# Patient Record
Sex: Male | Born: 1970 | Race: White | Hispanic: No | Marital: Married | Smoking: Never smoker
Health system: Southern US, Community
[De-identification: ages and names within clinical notes are randomized; demographics above are authoritative.]

## PROBLEM LIST (undated history)

## (undated) DIAGNOSIS — K219 Gastro-esophageal reflux disease without esophagitis: Secondary | ICD-10-CM

## (undated) DIAGNOSIS — K5900 Constipation, unspecified: Secondary | ICD-10-CM

## (undated) DIAGNOSIS — I099 Rheumatic heart disease, unspecified: Secondary | ICD-10-CM

## (undated) DIAGNOSIS — I00 Rheumatic fever without heart involvement: Secondary | ICD-10-CM

## (undated) DIAGNOSIS — E785 Hyperlipidemia, unspecified: Secondary | ICD-10-CM

## (undated) DIAGNOSIS — R011 Cardiac murmur, unspecified: Secondary | ICD-10-CM

## (undated) DIAGNOSIS — E781 Pure hyperglyceridemia: Secondary | ICD-10-CM

## (undated) HISTORY — DX: Gastro-esophageal reflux disease without esophagitis: K21.9

## (undated) HISTORY — DX: Cardiac murmur, unspecified: R01.1

## (undated) HISTORY — DX: Rheumatic fever without heart involvement: I00

## (undated) HISTORY — PX: INGUINAL HERNIA REPAIR: SUR1180

## (undated) HISTORY — DX: Constipation, unspecified: K59.00

## (undated) HISTORY — DX: Rheumatic heart disease, unspecified: I09.9

## (undated) HISTORY — DX: Hyperlipidemia, unspecified: E78.5

## (undated) HISTORY — DX: Pure hyperglyceridemia: E78.1

---

## 2009-09-25 ENCOUNTER — Encounter (INDEPENDENT_AMBULATORY_CARE_PROVIDER_SITE_OTHER): Payer: Self-pay | Admitting: *Deleted

## 2010-11-05 NOTE — Letter (Signed)
Summary: New Patient letter  [facility]  [address] [place], [place] [address]   Phone: [phone]  Fax: [phone]       09/25/2009 MRN: [id number]  Eduardo Mata [address] [place], [place]  [address]  Dear Mr. Eduardo Mata,  Welcome to the Gastroenterology Division at [facility].    You are scheduled to see Dr. Leone Payor on 10-30-09 at 10:00a.m. on the 3rd floor at [facility], [address].  We ask that you try to arrive at our office 15 minutes prior to your appointment time to allow for check-in.  We would like you to complete the enclosed self-administered evaluation form prior to your visit and bring it with you on the day of your appointment.  We will review it with you.  Also, please bring a complete list of all your medications or, if you prefer, bring the medication bottles and we will list them.  Please bring your insurance card so that we may make a copy of it.  If your insurance requires a referral to see a specialist, please bring your referral form from your primary care physician.  Co-payments are due at the time of your visit and may be paid by cash, check or credit card.     Your office visit will consist of a consult with your physician (includes a physical exam), any laboratory testing he/she may order, scheduling of any necessary diagnostic testing (e.g. x-ray, ultrasound, CT-scan), and scheduling of a procedure (e.g. Endoscopy, Colonoscopy) if required.  Please allow enough time on your schedule to allow for any/all of these possibilities.    If you cannot keep your appointment, please call [phone] to cancel or reschedule prior to your appointment date.  This allows [place] the opportunity to schedule an appointment for another patient in need of care.  If you do not cancel or reschedule by 5 p.m. the business day prior to your appointment date, you will be charged a $50.00 late cancellation/no-show fee.    Thank you for choosing  [facility] for your medical needs.  We appreciate the opportunity to care for you.  Please visit [place] at our website  to learn more about our practice.                     Sincerely,                                                             The Gastroenterology Division

## 2011-11-05 ENCOUNTER — Encounter: Payer: Self-pay | Admitting: Cardiology

## 2011-11-05 ENCOUNTER — Ambulatory Visit (INDEPENDENT_AMBULATORY_CARE_PROVIDER_SITE_OTHER): Payer: 59 | Admitting: Cardiology

## 2011-11-05 VITALS — BP 135/80 | HR 69 | Ht 68.0 in | Wt 183.0 lb

## 2011-11-05 DIAGNOSIS — R5381 Other malaise: Secondary | ICD-10-CM

## 2011-11-05 DIAGNOSIS — R5383 Other fatigue: Secondary | ICD-10-CM

## 2011-11-05 DIAGNOSIS — E781 Pure hyperglyceridemia: Secondary | ICD-10-CM

## 2011-11-05 DIAGNOSIS — I099 Rheumatic heart disease, unspecified: Secondary | ICD-10-CM

## 2011-11-05 NOTE — Progress Notes (Signed)
   HPI The patient presents for evaluation of rheumatic heart disease. He had a history of this age 41. He was followed over the years and treated with prophylaxis against recurrence. He's never been told that he had any heart disease however. He was discharged from the [employer] because of this history. He presents to have further evaluation as he has become concerned over the years that he could have some residual problem from this. He does note that he's had some decreased exercise tolerance. He says he tries to around a mile and he might get more fatigued than he thinks he should. However, he admits to not being as physically active as he used to be. He denies any chest pressure, neck or arm discomfort. He's not noticing any palpitations, presyncope or syncope. He has had no PND or orthopnea. He has had no weight gain or edema. He was recently found to have hypertriglyceridemia.  No Known Allergies  Current Outpatient Prescriptions  Medication Sig Dispense Refill  . fenofibrate (TRICOR) 145 MG tablet Take 145 mg by mouth daily.        Past Medical History  Diagnosis Date  . Rheumatic heart disease   . Hypertriglyceridemia     Past Surgical History  Procedure Date  . Inguinal hernia repair     bilateral   Family History:  No history of CAD or other significant history.  History   Social History  . Marital Status: Unknown    Spouse Name: N/A    Number of Children: 2  . Years of Education: N/A   Occupational History  . IT  [employer]   Social History Main Topics  . Smoking status: Never Smoker   . Smokeless tobacco: Not on file  . Alcohol Use: Not on file  . Drug Use: Not on file  . Sexually Active: Not on file   Other Topics Concern  . Not on file   Social History Narrative  . No narrative on file    ROS:  Positive for knee pain.  Otherwise as stated in the HPI and negative for all other systems.  PHYSICAL EXAM BP 135/80  Pulse 69  Ht 5\' 8"  (1.727 m)  Wt 183  lb (83.008 kg)  BMI 27.82 kg/m2 GENERAL:  Well appearing HEENT:  Pupils equal round and reactive, fundi not visualized, oral mucosa unremarkable NECK:  No jugular venous distention, waveform within normal limits, carotid upstroke brisk and symmetric, no bruits, no thyromegaly LYMPHATICS:  No cervical, inguinal adenopathy LUNGS:  Clear to auscultation bilaterally BACK:  No CVA tenderness CHEST:  Unremarkable HEART:  PMI not displaced or sustained,S1 and S2 within normal limits, no S3, no S4, no clicks, no rubs, no murmurs ABD:  Flat, positive bowel sounds normal in frequency in pitch, no bruits, no rebound, no guarding, no midline pulsatile mass, no hepatomegaly, no splenomegaly EXT:  2 plus pulses throughout, no edema, no cyanosis no clubbing SKIN:  No rashes no nodules NEURO:  Cranial nerves II through XII grossly intact, motor grossly intact throughout PSYCH:  Cognitively intact, oriented to person place and time  EKG:  Regular rhythm, unusual P wave axis possible ectopic atrial rhythm, minimal voltage criteria for left ventricular hypertrophy, nonspecific inferior T wave changes  11/05/2011  ASSESSMENT AND PLAN

## 2011-11-05 NOTE — Assessment & Plan Note (Signed)
He will continue with medications as listed. We discussed dietary changes that might help with this as well.

## 2011-11-05 NOTE — Assessment & Plan Note (Signed)
I will check an echocardiogram. If there are no valvular abnormalities then he can check back with me in a few years for routine physical exam. If there is any evidence of rheumatic involvement of his heart valves and I would follow him more closely.

## 2011-11-05 NOTE — Patient Instructions (Signed)

## 2011-11-05 NOTE — Assessment & Plan Note (Signed)
This is mild and I doubt that there is any cardiovascular etiology to this. We discussed an exercise regimen. If this should not improve with regular exercise I would want to see him back to reevaluate.

## 2011-11-12 ENCOUNTER — Ambulatory Visit (HOSPITAL_COMMUNITY): Payer: 59 | Attending: Cardiovascular Disease

## 2011-11-12 DIAGNOSIS — I079 Rheumatic tricuspid valve disease, unspecified: Secondary | ICD-10-CM | POA: Insufficient documentation

## 2011-11-12 DIAGNOSIS — I379 Nonrheumatic pulmonary valve disorder, unspecified: Secondary | ICD-10-CM | POA: Insufficient documentation

## 2011-11-12 DIAGNOSIS — I059 Rheumatic mitral valve disease, unspecified: Secondary | ICD-10-CM | POA: Insufficient documentation

## 2011-11-12 DIAGNOSIS — I099 Rheumatic heart disease, unspecified: Secondary | ICD-10-CM

## 2011-11-12 DIAGNOSIS — E785 Hyperlipidemia, unspecified: Secondary | ICD-10-CM | POA: Insufficient documentation

## 2011-11-12 DIAGNOSIS — R5381 Other malaise: Secondary | ICD-10-CM | POA: Insufficient documentation

## 2012-08-06 ENCOUNTER — Encounter (INDEPENDENT_AMBULATORY_CARE_PROVIDER_SITE_OTHER): Payer: Self-pay | Admitting: General Surgery

## 2012-08-09 ENCOUNTER — Ambulatory Visit (INDEPENDENT_AMBULATORY_CARE_PROVIDER_SITE_OTHER): Payer: 59 | Admitting: General Surgery

## 2012-08-09 ENCOUNTER — Encounter (INDEPENDENT_AMBULATORY_CARE_PROVIDER_SITE_OTHER): Payer: Self-pay | Admitting: General Surgery

## 2012-08-09 VITALS — BP 118/62 | HR 62 | Temp 98.0°F | Resp 18 | Ht 67.0 in | Wt 183.0 lb

## 2012-08-09 DIAGNOSIS — K648 Other hemorrhoids: Secondary | ICD-10-CM

## 2012-08-09 NOTE — Patient Instructions (Addendum)
You have large internal hemorrhoids, and we injected them with sclerosing solution today in hopes that will make them shrink away.  You do not have any bleeding or prolapse, and there is no indication for a surgical procedure at this time.  I strongly encouraged you to drink extra water, 6 or 7 glasses a day, and to take Metamucil or equivalent fiber supplement on a daily basis.  Please read the patient information booklet that you were given.  Return to see Dr. Derrell Lolling if further problems arise.

## 2012-08-09 NOTE — Progress Notes (Signed)
Patient ID: Eduardo Mata, male   DOB: 27-Oct-1970, 41 y.o.   MRN: [id number]  Chief Complaint  Patient presents with  . New Evaluation    hems    HPI Eduardo Mata is a 41 y.o. male.  He is referred by Dr. Charlott Rakes for evaluation of internal hemorrhoids.  The patient really doesn't have any symptoms of prolapse or bleeding or painful bowel movements. He says someone told him he had irritable bowel syndrome in the past. His symptoms really are more of a variant of constipation. He has to sit for a long time he says his stool passes slowly. He is using suppositories lately. His bowel movements pass slowly. His stools are not hard. He has a bowel movement either daily or about every 3 days ...it's variable. He has nottried  hydration or Fiber at this point.  Recent flexible sigmoidoscopy recently which showed  circumferential internal hemorrhoids but that was the only problem. He says Dr. Bosie Clos told him that was his problem.  Past history is significant for rheumatic fever as a child. Recent cardiology evaluation by Dr. Antoine Poche was apparently negative for any detectable heart disease. HPI  Past Medical History  Diagnosis Date  . Rheumatic heart disease   . Hypertriglyceridemia   . GERD (gastroesophageal reflux disease)   . Heart murmur   . Constipation     Past Surgical History  Procedure Date  . Inguinal hernia repair     bilateral    Family History  Problem Relation Age of Onset  . Barrett's esophagus Father     Social History History  Substance Use Topics  . Smoking status: Never Smoker   . Smokeless tobacco: Never Used  . Alcohol Use: No    No Known Allergies  Current Outpatient Prescriptions  Medication Sig Dispense Refill  . fenofibrate (TRICOR) 145 MG tablet Take 145 mg by mouth daily.      . hydrocortisone (ANUSOL-HC) 2.5 % rectal cream Place 1 application rectally 2 (two) times daily.      Maxwell Caul Bicarbonate (ZEGERID) 20-1100 MG  CAPS Take 1 capsule by mouth daily before breakfast.      . hydrocortisone (ANUSOL-HC) 25 MG suppository         Review of Systems Review of Systems  Constitutional: Negative for fever, chills and unexpected weight change.  HENT: Negative for hearing loss, congestion, sore throat, trouble swallowing and voice change.   Eyes: Negative for visual disturbance.  Respiratory: Negative for cough and wheezing.   Cardiovascular: Negative for chest pain, palpitations and leg swelling.  Gastrointestinal: Negative for nausea, vomiting, abdominal pain, diarrhea, constipation, blood in stool, abdominal distention, anal bleeding and rectal pain.  Genitourinary: Negative for hematuria and difficulty urinating.  Musculoskeletal: Negative for arthralgias.  Skin: Negative for rash and wound.  Neurological: Negative for seizures, syncope, weakness and headaches.  Hematological: Negative for adenopathy. Does not bruise/bleed easily.  Psychiatric/Behavioral: Negative for confusion.    Blood pressure 118/62, pulse 62, temperature 98 F (36.7 C), temperature source Oral, resp. rate 18, height 5\' 7"  (1.702 m), weight 183 lb (83.008 kg).  Physical Exam Physical Exam  Constitutional: He is oriented to person, place, and time. He appears well-developed and well-nourished. No distress.  HENT:  Head: Normocephalic.  Nose: Nose normal.  Mouth/Throat: No oropharyngeal exudate.  Eyes: Conjunctivae normal and EOM are normal. Pupils are equal, round, and reactive to light. Right eye exhibits no discharge. Left eye exhibits no discharge. No scleral icterus.  Neck:  Normal range of motion. Neck supple. No JVD present. No tracheal deviation present. No thyromegaly present.  Cardiovascular: Normal rate, regular rhythm, normal heart sounds and intact distal pulses.   No murmur heard. Pulmonary/Chest: Effort normal and breath sounds normal. No stridor. No respiratory distress. He has no wheezes. He has no rales. He  exhibits no tenderness.  Abdominal: Soft. Bowel sounds are normal. He exhibits no distension and no mass. There is no tenderness. There is no rebound and no guarding.  Genitourinary:       No external hemorrhoids, dermatitis, or perineal disease. Digital exam reveals increased sphincter tone, strong muscles, these do relax, no pain. No fissure. No blood. Anoscopy reveals internal hemorrhoids which were injected with sclerosing solution left lateral and right anterior. He tolerated this well.  Musculoskeletal: Normal range of motion. He exhibits no edema and no tenderness.  Lymphadenopathy:    He has no cervical adenopathy.  Neurological: He is alert and oriented to person, place, and time. He has normal reflexes. Coordination normal.  Skin: Skin is warm and dry. No rash noted. He is not diaphoretic. No erythema. No pallor.  Psychiatric: He has a normal mood and affect. His behavior is normal. Judgment and thought content normal.    Data Reviewed Dr. Marge Duncans office note and flexible sigmoidoscopy note  Assessment    Internal hemorrhoids. These may or may not be treating to his symptoms of prolonged defecation time and constipation. There may also be a component of irritable bowel syndrome to this.  Since there is no significant prolapse or bleeding, there does not appear to be any indication for surgical intervention    Plan    Internal hemorrhoids were injected with sclerosing solution today. Right anterior and left lateral.  For possible motility problems and IBS-like symptoms, emphasized the use of fiber diet, fiber supplementation, and hydration.  Information booklet given  Return to see me if further problems arise.       Eduardo Mata. Derrell Lolling, M.D., [facility] General and Minimally invasive Surgery Breast and Colorectal Surgery Office:   [phone] Pager:   [phone]  08/09/2012, 1:54 PM

## 2012-11-02 ENCOUNTER — Encounter (INDEPENDENT_AMBULATORY_CARE_PROVIDER_SITE_OTHER): Payer: Self-pay

## 2020-10-24 ENCOUNTER — Emergency Department (HOSPITAL_BASED_OUTPATIENT_CLINIC_OR_DEPARTMENT_OTHER): Payer: 59

## 2020-10-24 ENCOUNTER — Encounter (HOSPITAL_BASED_OUTPATIENT_CLINIC_OR_DEPARTMENT_OTHER): Payer: Self-pay | Admitting: Emergency Medicine

## 2020-10-24 ENCOUNTER — Other Ambulatory Visit: Payer: Self-pay

## 2020-10-24 ENCOUNTER — Emergency Department (HOSPITAL_BASED_OUTPATIENT_CLINIC_OR_DEPARTMENT_OTHER)
Admission: EM | Admit: 2020-10-24 | Discharge: 2020-10-24 | Disposition: A | Discharge status: Home / Self Care | Payer: 59 | Attending: Emergency Medicine | Admitting: Emergency Medicine

## 2020-10-24 DIAGNOSIS — R197 Diarrhea, unspecified: Secondary | ICD-10-CM

## 2020-10-24 DIAGNOSIS — R42 Dizziness and giddiness: Secondary | ICD-10-CM | POA: Insufficient documentation

## 2020-10-24 DIAGNOSIS — U071 COVID-19: Secondary | ICD-10-CM

## 2020-10-24 LAB — CBC WITH DIFFERENTIAL/PLATELET
Abs Immature Granulocytes: 0.05 10*3/uL (ref 0.00–0.07)
Basophils Absolute: 0 10*3/uL (ref 0.0–0.1)
Basophils Relative: 0 %
Eosinophils Absolute: 0 10*3/uL (ref 0.0–0.5)
Eosinophils Relative: 0 %
HCT: 47.8 % (ref 39.0–52.0)
Hemoglobin: 16.8 g/dL (ref 13.0–17.0)
Immature Granulocytes: 1 %
Lymphocytes Relative: 19 %
Lymphs Abs: 0.9 10*3/uL (ref 0.7–4.0)
MCH: 31.2 pg (ref 26.0–34.0)
MCHC: 35.1 g/dL (ref 30.0–36.0)
MCV: 88.8 fL (ref 80.0–100.0)
Monocytes Absolute: 0.3 10*3/uL (ref 0.1–1.0)
Monocytes Relative: 7 %
Neutro Abs: 3.4 10*3/uL (ref 1.7–7.7)
Neutrophils Relative %: 73 %
Platelets: 186 10*3/uL (ref 150–400)
RBC: 5.38 MIL/uL (ref 4.22–5.81)
RDW: 12.3 % (ref 11.5–15.5)
WBC: 4.7 10*3/uL (ref 4.0–10.5)
nRBC: 0 % (ref 0.0–0.2)

## 2020-10-24 LAB — COMPREHENSIVE METABOLIC PANEL
ALT: 32 U/L (ref 0–44)
AST: 29 U/L (ref 15–41)
Albumin: 4.1 g/dL (ref 3.5–5.0)
Alkaline Phosphatase: 65 U/L (ref 38–126)
Anion gap: 9 (ref 5–15)
BUN: 10 mg/dL (ref 6–20)
CO2: 26 mmol/L (ref 22–32)
Calcium: 8.6 mg/dL — ABNORMAL LOW (ref 8.9–10.3)
Chloride: 104 mmol/L (ref 98–111)
Creatinine, Ser: 0.91 mg/dL (ref 0.61–1.24)
GFR, Estimated: >60 mL/min (ref >60)
Glucose, Bld: 96 mg/dL (ref 70–99)
Potassium: 4 mmol/L (ref 3.5–5.1)
Sodium: 139 mmol/L (ref 135–145)
Total Bilirubin: 0.8 mg/dL (ref 0.3–1.2)
Total Protein: 6.9 g/dL (ref 6.5–8.1)

## 2020-10-24 LAB — URINALYSIS, ROUTINE W REFLEX MICROSCOPIC
Bilirubin Urine: NEGATIVE
Glucose, UA: NEGATIVE mg/dL
Hgb urine dipstick: NEGATIVE
Ketones, ur: NEGATIVE mg/dL
Leukocytes,Ua: NEGATIVE
Nitrite: NEGATIVE
Protein, ur: NEGATIVE mg/dL
Specific Gravity, Urine: 1.005 (ref 1.005–1.030)
pH: 6.5 (ref 5.0–8.0)

## 2020-10-24 LAB — TROPONIN I (HIGH SENSITIVITY)
Troponin I (High Sensitivity): 3 ng/L (ref <18)
Troponin I (High Sensitivity): 4 ng/L (ref <18)

## 2020-10-24 LAB — LIPASE, BLOOD: Lipase: 25 U/L (ref 11–51)

## 2020-10-24 MED ORDER — SODIUM CHLORIDE 0.9 % IV BOLUS
1000.0000 mL | Freq: Once | INTRAVENOUS | Status: AC
  Administered 2020-10-24: 1000 mL via INTRAVENOUS

## 2020-10-24 NOTE — ED Provider Notes (Signed)
[facility] EMERGENCY DEPARTMENT Provider Note   CSN: [id number] Arrival date & time: 10/24/20  0934     History Chief Complaint  Patient presents with  . Diarrhea    Eduardo Mata is a 50 y.o. male history of GERD, high triglycerides, rheumatic heart disease.  Patient presents today for diarrhea, fever/chills and lightheadedness.  Patient is unvaccinated against COVID-19 and reports that he was diagnosed with COVID 12 days ago.  Initially he had symptoms including body aches, headache, sore throat, loss of taste/smell and small episodes of nonbloody diarrhea and nausea.  His symptoms gradually improved but over the last 2 days his diarrhea has worsened he reports multiple episodes of nonbloody diarrhea over the past few days without abdominal pain.  He reports that while having diarrhea he will feel warm or chills, today he stood up and became lightheaded and felt like he was going to pass out, he sat down and the sensation passed without syncopal event.  Denies headache, vision changes, neck stiffness, chest pain/shortness of breath, abdominal pain, hematemesis, hemoptysis, melena, hematochezia, extremity swelling/color change, history of blood clot, exogenous hormone use, recent surgery/immobilization or any additional concerns.  HPI     Past Medical History:  Diagnosis Date  . Constipation   . GERD (gastroesophageal reflux disease)   . Heart murmur   . Hypertriglyceridemia   . Rheumatic heart disease     Patient Active Problem List   Diagnosis Date Noted  . Fatigue 11/05/2011  . Rheumatic heart disease   . Hypertriglyceridemia     Past Surgical History:  Procedure Laterality Date  . INGUINAL HERNIA REPAIR     bilateral       Family History  Problem Relation Age of Onset  . Barrett's esophagus Father     Social History   Tobacco Use  . Smoking status: Never Smoker  . Smokeless tobacco: Never Used  Substance Use Topics  . Alcohol use: Yes  .  Drug use: No    Home Medications Prior to Admission medications   Medication Sig Start Date End Date Taking? Authorizing Provider  fenofibrate (TRICOR) 145 MG tablet Take 145 mg by mouth daily.    [provider]  hydrocortisone (ANUSOL-HC) 2.5 % rectal cream Place 1 application rectally 2 (two) times daily.    [provider]  hydrocortisone (ANUSOL-HC) 25 MG suppository  07/26/12   [provider]  Omeprazole-Sodium Bicarbonate (ZEGERID) 20-1100 MG CAPS Take 1 capsule by mouth daily before breakfast.    [provider]    Allergies    Patient has no known allergies.  Review of Systems   Review of Systems ten systems are reviewed and are negative for acute change except as noted in the HPI  Physical Exam Updated Vital Signs BP 125/80 (BP Location: Right Arm)   Pulse 71   Temp 97.6 F (36.4 C) (Oral)   Resp 15   Ht 5\' 8"  (1.727 m)   Wt 81.6 kg   SpO2 98%   BMI 27.37 kg/m   Physical Exam Constitutional:      General: He is not in acute distress.    Appearance: Normal appearance. He is well-developed. He is not ill-appearing or diaphoretic.  HENT:     Head: Normocephalic and atraumatic.     Right Ear: External ear normal.     Left Ear: External ear normal.     Nose: Rhinorrhea present.     Mouth/Throat:     Mouth: Mucous membranes are  moist.     Pharynx: Oropharynx is clear.  Eyes:     General: Vision grossly intact. Gaze aligned appropriately.     Conjunctiva/sclera: Conjunctivae normal.     Pupils: Pupils are equal, round, and reactive to light.  Neck:     Trachea: Trachea and phonation normal.  Cardiovascular:     Rate and Rhythm: Normal rate and regular rhythm.     Pulses: Normal pulses.  Pulmonary:     Effort: Pulmonary effort is normal. No respiratory distress.  Abdominal:     General: There is no distension.     Palpations: Abdomen is soft.     Tenderness: There is no abdominal tenderness. There is no guarding or  rebound.  Musculoskeletal:        General: Normal range of motion.     Cervical back: Normal range of motion.     Right lower leg: No edema.     Left lower leg: No edema.  Skin:    General: Skin is warm and dry.  Neurological:     Mental Status: He is alert.     GCS: GCS eye subscore is 4. GCS verbal subscore is 5. GCS motor subscore is 6.     Comments: Speech is clear and goal oriented, follows commands Major Cranial nerves without deficit, no facial droop Moves extremities without ataxia, coordination intact  Psychiatric:        Behavior: Behavior normal.     ED Results / Procedures / Treatments   Labs (all labs ordered are listed, but only abnormal results are displayed) Labs Reviewed  URINALYSIS, ROUTINE W REFLEX MICROSCOPIC - Abnormal; Notable for the following components:      Result Value   Color, Urine STRAW (*)    All other components within normal limits  COMPREHENSIVE METABOLIC PANEL - Abnormal; Notable for the following components:   Calcium 8.6 (*)    All other components within normal limits  CBC WITH DIFFERENTIAL/PLATELET  LIPASE, BLOOD  TROPONIN I (HIGH SENSITIVITY)  TROPONIN I (HIGH SENSITIVITY)    EKG EKG Interpretation  Date/Time:  Wednesday October 24 2020 10:56:28 EST Ventricular Rate:  80 PR Interval:    QRS Duration: 77 QT Interval:  357 QTC Calculation: 412 R Axis:   27 Text Interpretation: Sinus rhythm Confirmed by Benjiman Core [phone]) on 10/24/2020 1:42:37 PM   Radiology DG Chest Portable 1 View  Result Date: 10/24/2020 CLINICAL DATA:  COVID diagnosis on the 1/7.  Chills.  Dizzy. EXAM: PORTABLE CHEST 1 VIEW COMPARISON:  None. FINDINGS: The heart size and mediastinal contours are within normal limits. Both lungs are clear. No visible pleural effusions or pneumothorax. No acute osseous abnormality. IMPRESSION: No active disease. Electronically Signed   By: Feliberto Harts MD   On: 10/24/2020 11:04    Procedures Procedures  (including critical care time)  Medications Ordered in ED Medications  sodium chloride 0.9 % bolus 1,000 mL (0 mLs Intravenous Stopped 10/24/20 1252)    ED Course  I have reviewed the triage vital signs and the nursing notes.  Pertinent labs & imaging results that were available during my care of the patient were reviewed by me and considered in my medical decision making (see chart for details).    MDM Rules/Calculators/A&P                         Additional history obtained from: 1. Nursing notes from this visit. 2. Review of electronic medical records, no  recent visits available. --------------------- 50 year old unvaccinated male presents today after being diagnosed with COVID 12 days ago.  His symptoms of rhinorrhea, body aches, loss of taste and smell, sore throat have resolved.  He has had persistent nonbloody diarrhea that has worsened over the past few days without associated abdominal pain.  He reports a near syncopal episode today without associated chest pain or shortness of breath.  Physical examination is reassuring, cranial nerves intact, no meningeal signs, airway clear without evidence of deep space infection.  Cardiopulmonary exam unremarkable.  Abdomen soft nontender.  Neurovascular intact to all four extremities without evidence of DVT.  Patient's history is concerning for possible dehydration in setting multiple days of diarrhea.  Will obtain basic labs and give IV fluids and obtain orthostatic vital signs. ---------------------------------------------- I ordered, reviewed and interpreted labs which include: CBC within normal limits, no leukocytosis to suggest bacterial infection, no anemia. CMP shows no emergent electrolyte derangement, AKI, LFT elevations or gap.  Lipase within normal limits, doubt pancreatitis. Urinalysis shows no evidence of infection and no hemoglobin. High-sensitivity troponin within normal limits x2 (3->4) CXR:  IMPRESSION:  No active disease.    EKG: Sinus rhythm Confirmed by Benjiman CorePickering, Nathan [phone]([id number]) on 10/24/2020 1:42:37 PM  Orthostatics within normal limits. ---------------------------------- Patient's work-up today is reassuring.  His 1 episode of lightheadedness has not recurred and his orthostatics are reassuring.  Vital signs have remained stable throughout ER visit.  Patient without chest pain or shortness of breath, low suspicion for ACS, PE, dissection, CVA/TIA, hypoglycemia, anemia, arrhythmia or other emergent pathologies as etiology of symptoms today.  Suspect patient with some dehydration orthostasis in the setting of multiple days of diarrhea.  Patient has no abdominal tenderness on exam low suspicion for appendicitis, diverticulitis or other emergent intra-abdominal pathologies, no indication for CT imaging of the abdomen/pelvis at this time.  Patient has been tolerating p.o. in the emergency department, I encourage patient to maintain water hydration get plenty of rest and follow-up with his PCP this week.  At this time there does not appear to be any evidence of an acute emergency medical condition and the patient appears stable for discharge with appropriate outpatient follow up. Diagnosis was discussed with patient who verbalizes understanding of care plan and is agreeable to discharge. I have discussed return precautions with patient who verbalizes understanding. Patient encouraged to follow-up with their PCP. All questions answered.  Patient's case discussed with Rubin PayorPickering who agrees with plan to discharge with follow-up.   Eduardo Mata was evaluated in Emergency Department on 10/24/2020 for the symptoms described in the history of present illness. He was evaluated in the context of the global COVID-19 pandemic, which necessitated consideration that the patient might be at risk for infection with the SARS-CoV-2 virus that causes COVID-19. Institutional protocols and algorithms that pertain to the evaluation of patients  at risk for COVID-19 are in a state of rapid change based on information released by regulatory bodies including the CDC and federal and state organizations. These policies and algorithms were followed during the patient's care in the ED.  Note: Portions of this report may have been transcribed using voice recognition software. Every effort was made to ensure accuracy; however, inadvertent computerized transcription errors may still be present. Final Clinical Impression(s) / ED Diagnoses Final diagnoses:  Diarrhea, unspecified type  COVID-19    Rx / DC Orders ED Discharge Orders    None       Bill SalinasMorelli, Eunice Oldaker A, PA-C 10/24/20 1350  Benjiman Core, MD 10/24/20 1525

## 2020-10-24 NOTE — Discharge Instructions (Addendum)
At this time there does not appear to be the presence of an emergent medical condition, however there is always the potential for conditions to change. Please read and follow the below instructions.  Please return to the Emergency Department immediately for any new or worsening symptoms. Please be sure to follow up with your Primary Care Provider within one week regarding your visit today; please call their office to schedule an appointment even if you are feeling better for a follow-up visit. Please drink plenty of water to avoid dehydration and get plenty of rest.  Go to the nearest Emergency Department immediately if: You have fever or chills You have chest pain. You feel very weak or you pass out (faint). You have bloody or black poop or poop that looks like tar. You have very bad pain, cramping, or bloating in your belly (abdomen). You have trouble breathing or you are breathing very quickly. Your heart is beating very quickly. You pass out (loss consciousness) Your skin feels cold and clammy. You feel confused. You have signs of losing too much water in your body, such as: Dark pee, very little pee, or no pee. Cracked lips. Dry mouth. Sunken eyes. Sleepiness. Weakness. You have any new/concerning or worsening of symptoms   Please read the additional information packets attached to your discharge summary.  Do not take your medicine if  develop an itchy rash, swelling in your mouth or lips, or difficulty breathing; call 911 and seek immediate emergency medical attention if this occurs.  You may review your lab tests and imaging results in their entirety on your MyChart account.  Please discuss all results of fully with your primary care provider and other specialist at your follow-up visit.  Note: Portions of this text may have been transcribed using voice recognition software. Every effort was made to ensure accuracy; however, inadvertent computerized transcription errors may  still be present.

## 2020-10-24 NOTE — ED Triage Notes (Signed)
States dx with covid on the 7 and started to have  Diarrhea and chills and felt dizzy today

## 2021-01-11 ENCOUNTER — Other Ambulatory Visit: Payer: Self-pay

## 2021-01-11 ENCOUNTER — Other Ambulatory Visit: Payer: 59 | Admitting: Internal Medicine

## 2021-01-11 DIAGNOSIS — Z0000 Encounter for general adult medical examination without abnormal findings: Secondary | ICD-10-CM

## 2021-01-11 DIAGNOSIS — I099 Rheumatic heart disease, unspecified: Secondary | ICD-10-CM

## 2021-01-11 DIAGNOSIS — Z125 Encounter for screening for malignant neoplasm of prostate: Secondary | ICD-10-CM

## 2021-01-11 DIAGNOSIS — E781 Pure hyperglyceridemia: Secondary | ICD-10-CM

## 2021-01-12 LAB — CBC WITH DIFFERENTIAL/PLATELET
Absolute Monocytes: 359 cells/uL (ref 200–950)
Basophils Absolute: 18 cells/uL (ref 0–200)
Basophils Relative: 0.4 %
Eosinophils Absolute: 32 cells/uL (ref 15–500)
Eosinophils Relative: 0.7 %
HCT: 47.3 % (ref 38.5–50.0)
Hemoglobin: 16.1 g/dL (ref 13.2–17.1)
Lymphs Abs: 1748 cells/uL (ref 850–3900)
MCH: 31.5 pg (ref 27.0–33.0)
MCHC: 34 g/dL (ref 32.0–36.0)
MCV: 92.6 fL (ref 80.0–100.0)
MPV: 8.9 fL (ref 7.5–12.5)
Monocytes Relative: 7.8 %
Neutro Abs: 2443 cells/uL (ref 1500–7800)
Neutrophils Relative %: 53.1 %
Platelets: 215 10*3/uL (ref 140–400)
RBC: 5.11 10*6/uL (ref 4.20–5.80)
RDW: 12.8 % (ref 11.0–15.0)
Total Lymphocyte: 38 %
WBC: 4.6 10*3/uL (ref 3.8–10.8)

## 2021-01-12 LAB — COMPLETE METABOLIC PANEL WITH GFR
AG Ratio: 2.3 (calc) (ref 1.0–2.5)
ALT: 16 U/L (ref 9–46)
AST: 21 U/L (ref 10–35)
Albumin: 5.3 g/dL — ABNORMAL HIGH (ref 3.6–5.1)
Alkaline phosphatase (APISO): 77 U/L (ref 35–144)
BUN: 13 mg/dL (ref 7–25)
CO2: 28 mmol/L (ref 20–32)
Calcium: 10.1 mg/dL (ref 8.6–10.3)
Chloride: 103 mmol/L (ref 98–110)
Creat: 0.98 mg/dL (ref 0.70–1.33)
GFR, Est African American: 103 mL/min/{1.73_m2} (ref >=60)
GFR, Est Non African American: 89 mL/min/{1.73_m2} (ref >=60)
Globulin: 2.3 g/dL (calc) (ref 1.9–3.7)
Glucose, Bld: 83 mg/dL (ref 65–99)
Potassium: 4 mmol/L (ref 3.5–5.3)
Sodium: 140 mmol/L (ref 135–146)
Total Bilirubin: 0.8 mg/dL (ref 0.2–1.2)
Total Protein: 7.6 g/dL (ref 6.1–8.1)

## 2021-01-12 LAB — LIPID PANEL
Cholesterol: 237 mg/dL — ABNORMAL HIGH (ref <200)
HDL: 37 mg/dL — ABNORMAL LOW (ref >=40)
LDL Cholesterol (Calc): 166 mg/dL (calc) — ABNORMAL HIGH
Non-HDL Cholesterol (Calc): 200 mg/dL (calc) — ABNORMAL HIGH (ref <130)
Total CHOL/HDL Ratio: 6.4 (calc) — ABNORMAL HIGH (ref <5.0)
Triglycerides: 183 mg/dL — ABNORMAL HIGH (ref <150)

## 2021-01-12 LAB — PSA: PSA: 0.56 ng/mL (ref <=4.0)

## 2021-01-15 ENCOUNTER — Ambulatory Visit (INDEPENDENT_AMBULATORY_CARE_PROVIDER_SITE_OTHER): Payer: 59 | Admitting: Internal Medicine

## 2021-01-15 ENCOUNTER — Other Ambulatory Visit: Payer: Self-pay

## 2021-01-15 ENCOUNTER — Encounter: Payer: Self-pay | Admitting: Internal Medicine

## 2021-01-15 VITALS — BP 120/80 | HR 88 | Ht 68.0 in | Wt 170.0 lb

## 2021-01-15 DIAGNOSIS — Z8719 Personal history of other diseases of the digestive system: Secondary | ICD-10-CM

## 2021-01-15 DIAGNOSIS — E782 Mixed hyperlipidemia: Secondary | ICD-10-CM | POA: Diagnosis not present

## 2021-01-15 DIAGNOSIS — K4090 Unilateral inguinal hernia, without obstruction or gangrene, not specified as recurrent: Secondary | ICD-10-CM

## 2021-01-15 DIAGNOSIS — Z23 Encounter for immunization: Secondary | ICD-10-CM

## 2021-01-15 DIAGNOSIS — I099 Rheumatic heart disease, unspecified: Secondary | ICD-10-CM | POA: Diagnosis not present

## 2021-01-15 DIAGNOSIS — K219 Gastro-esophageal reflux disease without esophagitis: Secondary | ICD-10-CM

## 2021-01-15 DIAGNOSIS — Z0000 Encounter for general adult medical examination without abnormal findings: Secondary | ICD-10-CM

## 2021-01-15 LAB — POCT URINALYSIS DIPSTICK
Appearance: NEGATIVE
Bilirubin, UA: NEGATIVE
Blood, UA: NEGATIVE
Glucose, UA: NEGATIVE
Ketones, UA: NEGATIVE
Leukocytes, UA: NEGATIVE
Nitrite, UA: NEGATIVE
Odor: NEGATIVE
Protein, UA: NEGATIVE
Spec Grav, UA: 1.015 (ref 1.010–1.025)
Urobilinogen, UA: 0.2 E.U./dL
pH, UA: 6 (ref 5.0–8.0)

## 2021-01-15 MED ORDER — ROSUVASTATIN CALCIUM 5 MG PO TABS: 5.0000 mg | ORAL_TABLET | Freq: Every day | ORAL | 3 refills | Status: DC

## 2021-01-15 NOTE — Progress Notes (Addendum)
Subjective:    Patient ID: Eduardo Mata, male    DOB: [birth year], 50 y.o.   MRN: [id number]  HPI 50 year old Male presents to the office for the first time today for health maintenance exam.  He was referred by Patterson Hammersmith.  Patient says he had rheumatic fever diagnosed in 82 when he was 50 years old.  As far as he knows he has had no significant sequela from that.  Had left inguinal hernia repair 1998 by Dr. Delrae Alfred in [facility].  He thinks he may have another hernia on the right side.  He has noticed a bulge in the right inguinal area.  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  Takes multivitamin, Krill oil and vitamin C&D.  Last tetanus immunization was in 2012 given for his work.  He has a history of Barrett's esophagus and reflux followed by Dr. Bosie Clos in [facility].  Has been treated with Zegerid and Dexilant in the past.  He is currently on Nexium.  History of internal hemorrhoids seen by Dr. Claud Kelp.  History of constipation.  Internal hemorrhoids were injected with sclerosing solution by Dr. Derrell Lolling in 2013.  He saw Dr. Antoine Poche in January 2013 for cardiac evaluation with history of rheumatic heart disease apparently treated with antibiotics.  He has never been told he has any structural heart disease.  Apparently was discharged from [employer] because of this history.  Dr. Antoine Poche performed a 2D echocardiogram.  Mitral valve was normal.  Trivial regurgitation noted.  No stenosis.  Tricuspid valve normal with trivial regurgitation.  Aorta was normal and not dilated.  No defect noted in atrial septum.  Left atrium was normal in size.  Left ventricle was normal.  Patient has been taking TriCor for hyperlipidemia.  I would like for him down to try Crestor 5 mg daily because he has mixed hyperlipidemia.  Patient requests another 2D echocardiogram and would like to see Dr. Antoine Poche once again.  He had last tetanus immunization in 2012 at  work and this was updated today.  Family history remarkable for kidney stones in both parents otherwise negative.  Social history: He is married.  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  He completed 4 years of college.  Enjoys swimming tennis walking and travel.  Does not smoke and rarely drinks alcohol.  He resides in [place].  Wife is employed by [employer].  He has 2 children.     Says  he was told he had a heart murmur in the remote past however Dr. Antoine Poche did not hear a murmur when he saw him in 2013.    Objective:   Physical Exam Blood pressure 120/80 pulse 88 regular pulse oximetry 97% weight 170 pounds height 5 feet 8 inches BMI 25.85  Pleasant male in no acute distress.  Respiratory rate is normal.  Skin: Warm and dry.  Nodes: None.  TMs are clear.  Neck is supple without JVD thyromegaly or carotid bruits.  Chest is clear to auscultation without rales or wheezing.  Cardiac exam: Regular rate and rhythm normal S1 and S2 without murmurs or gallops appreciated.  Abdomen is soft nondistended without hepatosplenomegaly masses or tenderness.  No lower extremity pitting edema or deformity.  Prostate is normal without masses.  Neuro: Intact without gross focal deficits.  Has bulge in right inguinal area and likely has hernia.  I cannot confirm hernia with direct palpation but he has  fullness in the right inguinal area.       Assessment & Plan:  Patient gives history of rheumatic heart disease but evaluation by Dr. Antoine Poche in 2013 was normal.  Patient would like to see Dr. Antoine Poche once again for evaluation.  Hyperlipidemia-mixed: he will be started on Crestor 5 mg daily with follow-up here in 3 months.  History of GE reflux and Barrett's esophagus currently being treated by gastroenterologist with Nexium 20 mg daily.  Dr. Bosie Clos is gastroenterologist.  Had colon cancer screening February 2022.  History of internal hemorrhoids seen by  Dr. Derrell Lolling several years ago  Health maintenance-tetanus immunization update given today  Likely has right inguinal hernia with bulge in the right pubic area.  Discussion regarding diet and exercise.  Needs to watch weight and get regular exercise.  Advised follow-up on hyperlipidemia here in July with lipid panel liver functions and office visit.  Referral being made to Dr. Antoine Poche.

## 2021-01-16 NOTE — Patient Instructions (Signed)
It was a pleasure to see you today.  Please start Crestor 5 mg daily and follow-up here in July with fasting lipid panel and office visit.  We will make referral to Dr. Antoine Poche as you requested.  Tetanus immunization update given today.

## 2021-01-22 ENCOUNTER — Telehealth: Payer: Self-pay | Admitting: Internal Medicine

## 2021-01-22 ENCOUNTER — Other Ambulatory Visit: Payer: Self-pay

## 2021-01-22 DIAGNOSIS — I099 Rheumatic heart disease, unspecified: Secondary | ICD-10-CM

## 2021-01-22 NOTE — Telephone Encounter (Signed)
Pt called and said he was told last week to go have an echocardiogram done and he went to do it and they said order was deleted and said they couldn't schedule it because it was deleted. He said he was told to call us back. Pt request call back with what to do from here

## 2021-01-22 NOTE — Telephone Encounter (Signed)
I have taken care of this

## 2021-01-22 NOTE — Telephone Encounter (Signed)
It has been reordered

## 2021-02-13 ENCOUNTER — Other Ambulatory Visit: Payer: Self-pay

## 2021-02-13 ENCOUNTER — Ambulatory Visit (HOSPITAL_COMMUNITY): Payer: 59 | Attending: Cardiology

## 2021-02-13 DIAGNOSIS — I099 Rheumatic heart disease, unspecified: Secondary | ICD-10-CM | POA: Insufficient documentation

## 2021-02-13 LAB — ECHOCARDIOGRAM COMPLETE
Area-P 1/2: 2.94 cm2
S' Lateral: 2.6 cm

## 2021-03-06 ENCOUNTER — Encounter (HOSPITAL_BASED_OUTPATIENT_CLINIC_OR_DEPARTMENT_OTHER): Payer: Self-pay | Admitting: Urology

## 2021-03-06 ENCOUNTER — Telehealth: Payer: Self-pay | Admitting: Internal Medicine

## 2021-03-06 ENCOUNTER — Other Ambulatory Visit: Payer: Self-pay

## 2021-03-06 ENCOUNTER — Emergency Department (HOSPITAL_BASED_OUTPATIENT_CLINIC_OR_DEPARTMENT_OTHER): Payer: 59

## 2021-03-06 ENCOUNTER — Emergency Department (HOSPITAL_BASED_OUTPATIENT_CLINIC_OR_DEPARTMENT_OTHER)
Admission: EM | Admit: 2021-03-06 | Discharge: 2021-03-06 | Disposition: A | Discharge status: Home / Self Care | Payer: 59 | Attending: Emergency Medicine | Admitting: Emergency Medicine

## 2021-03-06 DIAGNOSIS — R202 Paresthesia of skin: Secondary | ICD-10-CM | POA: Diagnosis present

## 2021-03-06 DIAGNOSIS — M542 Cervicalgia: Secondary | ICD-10-CM | POA: Diagnosis not present

## 2021-03-06 DIAGNOSIS — R200 Anesthesia of skin: Secondary | ICD-10-CM

## 2021-03-06 DIAGNOSIS — R251 Tremor, unspecified: Secondary | ICD-10-CM | POA: Insufficient documentation

## 2021-03-06 LAB — CBC WITH DIFFERENTIAL/PLATELET
Abs Immature Granulocytes: 0.02 10*3/uL (ref 0.00–0.07)
Basophils Absolute: 0 10*3/uL (ref 0.0–0.1)
Basophils Relative: 0 %
Eosinophils Absolute: 0 10*3/uL (ref 0.0–0.5)
Eosinophils Relative: 0 %
HCT: 41.5 % (ref 39.0–52.0)
Hemoglobin: 14.3 g/dL (ref 13.0–17.0)
Immature Granulocytes: 0 %
Lymphocytes Relative: 27 %
Lymphs Abs: 1.9 10*3/uL (ref 0.7–4.0)
MCH: 31.2 pg (ref 26.0–34.0)
MCHC: 34.5 g/dL (ref 30.0–36.0)
MCV: 90.6 fL (ref 80.0–100.0)
Monocytes Absolute: 0.5 10*3/uL (ref 0.1–1.0)
Monocytes Relative: 7 %
Neutro Abs: 4.4 10*3/uL (ref 1.7–7.7)
Neutrophils Relative %: 66 %
Platelets: 192 10*3/uL (ref 150–400)
RBC: 4.58 MIL/uL (ref 4.22–5.81)
RDW: 12.4 % (ref 11.5–15.5)
WBC: 6.8 10*3/uL (ref 4.0–10.5)
nRBC: 0 % (ref 0.0–0.2)

## 2021-03-06 LAB — COMPREHENSIVE METABOLIC PANEL
ALT: 20 U/L (ref 0–44)
AST: 23 U/L (ref 15–41)
Albumin: 4.6 g/dL (ref 3.5–5.0)
Alkaline Phosphatase: 65 U/L (ref 38–126)
Anion gap: 7 (ref 5–15)
BUN: 11 mg/dL (ref 6–20)
CO2: 25 mmol/L (ref 22–32)
Calcium: 9.2 mg/dL (ref 8.9–10.3)
Chloride: 104 mmol/L (ref 98–111)
Creatinine, Ser: 0.89 mg/dL (ref 0.61–1.24)
GFR, Estimated: >60 mL/min (ref >60)
Glucose, Bld: 87 mg/dL (ref 70–99)
Potassium: 3.5 mmol/L (ref 3.5–5.1)
Sodium: 136 mmol/L (ref 135–145)
Total Bilirubin: 0.8 mg/dL (ref 0.3–1.2)
Total Protein: 7.3 g/dL (ref 6.5–8.1)

## 2021-03-06 MED ORDER — DEXAMETHASONE SODIUM PHOSPHATE 10 MG/ML IJ SOLN
10.0000 mg | Freq: Once | INTRAMUSCULAR | Status: AC
  Administered 2021-03-06: 10 mg via INTRAVENOUS
  Filled 2021-03-06: qty 1

## 2021-03-06 NOTE — Telephone Encounter (Signed)
Eduardo Mata  [phone]  [name] had sent a mychart message on 03/05/2021 wanting an appointment about his fingers shaking and twitching. So I sent a message back asking when this started, trying to get more information. He sent back that maybe it started post COVID, two months ago, not saying it is related. Occasionally on left ring finger but has become more persistent now and includes more fingers on both hands.

## 2021-03-06 NOTE — ED Provider Notes (Signed)
[facility] EMERGENCY DEPARTMENT Provider Note   CSN: [id number] Arrival date & time: 03/06/21  1823     History Chief Complaint  Patient presents with  . Numbness  . Tremors    Eduardo Mata is a 50 y.o. male with PMH/o GERD, heart murmur, hypertriglyceridemia, rheumatic heart disease who presents for evaluation of numbness and tingling to bilateral hands and bilateral feet that has been ongoing for the last 3 days.  He states that he notices it more at night and states that when he is asleep, he will feel like it goes from his mid thighs all the way down.  He states sometimes he has a burning sensation in the legs and then he feels like his feet and the bottoms of his feet are numb.  He states if he gets up and walks around, this is better.  He states that he has some of the numbness in the plantar surface of his feet throughout the day but it is not as bad.  He also reports that he has tingling and numbness in his bilateral hands.  He states that this is worse at night as well.  At night, he will have numbness that extends from the right mid arm all the way distally.  He states that the right upper extremity is isolated mainly to the right hand.  He states that it is better when he gets up and moves around.  He also reports that he has had a tremor in his left fourth and fifth digit that has been ongoing for about a month or so.  He states it gets worse when he is moving it and when he closes his fingers together, he feels like he can stabilize it.  He has not sought evaluation for this.  He comes in the emergency department today because of the new numbness/tingling.  He states he has also been having some pain in the mid neck.  He states that this is new as well.  No trauma, injury.  He denies any fevers, chills, chest pain, difficulty breathing, abdominal pain, nausea/vomiting, weakness of his extremities.  No family history of brain cancer, MS.  He does report his grandfather had  Parkinson's.  The history is provided by the patient.       Past Medical History:  Diagnosis Date  . Constipation   . GERD (gastroesophageal reflux disease)   . Heart murmur   . Hypertriglyceridemia   . Rheumatic heart disease     Patient Active Problem List   Diagnosis Date Noted  . Fatigue 11/05/2011  . Rheumatic heart disease   . Hypertriglyceridemia     Past Surgical History:  Procedure Laterality Date  . INGUINAL HERNIA REPAIR     bilateral       Family History  Problem Relation Age of Onset  . Barrett's esophagus Father     Social History   Tobacco Use  . Smoking status: Never Smoker  . Smokeless tobacco: Never Used  Substance Use Topics  . Alcohol use: Yes    Comment: occasionally   . Drug use: No    Home Medications Prior to Admission medications   Medication Sig Start Date End Date Taking? Authorizing Provider  Ascorbic Acid (VITAMIN C) 500 MG CAPS See admin instructions.    [provider]  Cholecalciferol (VITAMIN D3 MAXIMUM STRENGTH) 125 MCG (5000 UT) capsule See admin instructions.    [provider]  esomeprazole (NEXIUM) 20 MG capsule 1 capsule  [provider]  Multiple Vitamins-Minerals (MULTIVITAMIN ADULTS 50+ PO) 1 tablet    [provider]  omega-3 acid ethyl esters (LOVAZA) 1 g capsule Take by mouth 2 (two) times daily.    [provider]  rosuvastatin (CRESTOR) 5 MG tablet Take 1 tablet (5 mg total) by mouth daily. 01/15/21   Margaree Mackintosh, MD    Allergies    Patient has no known allergies.  Review of Systems   Review of Systems  Constitutional: Negative for fever.  Respiratory: Negative for cough and shortness of breath.   Cardiovascular: Negative for chest pain.  Gastrointestinal: Negative for abdominal pain, nausea and vomiting.  Genitourinary: Negative for dysuria and hematuria.  Musculoskeletal: Positive for neck pain.  Neurological: Positive for numbness. Negative for  weakness and headaches.  All other systems reviewed and are negative.   Physical Exam Updated Vital Signs BP 133/86 (BP Location: Left Arm)   Pulse 85   Temp 98.7 F (37.1 C) (Oral)   Resp 18   Ht 5\' 8"  (1.727 m)   Wt 73 kg   SpO2 99%   BMI 24.48 kg/m   Physical Exam Vitals and nursing note reviewed.  Constitutional:      Appearance: Normal appearance. He is well-developed.  HENT:     Head: Normocephalic and atraumatic.  Eyes:     General: Lids are normal.     Conjunctiva/sclera: Conjunctivae normal.     Pupils: Pupils are equal, round, and reactive to light.     Comments: PERRL. EOMs intact. No nystagmus. No neglect.   Neck:      Comments: Tenderness palpation in midline C-spine.  No deformity crepitus noted.  No overlying warmth, erythema, edema. Cardiovascular:     Rate and Rhythm: Normal rate and regular rhythm.     Pulses: Normal pulses.          Radial pulses are 2+ on the right side and 2+ on the left side.       Dorsalis pedis pulses are 2+ on the right side and 2+ on the left side.     Heart sounds: Normal heart sounds. No murmur heard. No friction rub. No gallop.   Pulmonary:     Effort: Pulmonary effort is normal.     Breath sounds: Normal breath sounds.     Comments: Lungs clear to auscultation bilaterally.  Symmetric chest rise.  No wheezing, rales, rhonchi. Abdominal:     Palpations: Abdomen is soft. Abdomen is not rigid.     Tenderness: There is no abdominal tenderness. There is no guarding.     Comments: Abdomen is soft, non-distended, non-tender. No rigidity, No guarding. No peritoneal signs.  Musculoskeletal:        General: Normal range of motion.  Skin:    General: Skin is warm and dry.     Capillary Refill: Capillary refill takes less than 2 seconds.     Comments: Good distal cap refill. BUE and BLE is not dusky in appearance or cool to touch.  Neurological:     Mental Status: He is alert and oriented to person, place, and time.     Comments:  Cranial nerves III-XII intact Follows commands, Moves all extremities  5/5 strength to BUE and BLE  Reports some decrease sensation noted to the left fourth and fifth digit.  Reports some decrease sensation in plantar surface of bilateral feet. Normal finger to nose. No dysdiadochokinesia. No slurred speech. No facial droop.   Psychiatric:  Speech: Speech normal.     ED Results / Procedures / Treatments   Labs (all labs ordered are listed, but only abnormal results are displayed) Labs Reviewed  COMPREHENSIVE METABOLIC PANEL  CBC WITH DIFFERENTIAL/PLATELET  VITAMIN B12  TSH    EKG None  Radiology CT Head Wo Contrast  Result Date: 03/06/2021 CLINICAL DATA:  Numbness, tingling to hands and feet EXAM: CT HEAD WITHOUT CONTRAST TECHNIQUE: Contiguous axial images were obtained from the base of the skull through the vertex without intravenous contrast. COMPARISON:  None. FINDINGS: Brain: Calcifications within the right cerebellum. No acute intracranial abnormality. Specifically, no hemorrhage, hydrocephalus, mass lesion, acute infarction, or significant intracranial injury. Vascular: No hyperdense vessel or unexpected calcification. Skull: No acute calvarial abnormality. Sinuses/Orbits: No acute findings Other: None IMPRESSION: No acute intracranial abnormality. Electronically Signed   By: Charlett Nose M.D.   On: 03/06/2021 20:00   CT Cervical Spine Wo Contrast  Result Date: 03/06/2021 CLINICAL DATA:  Numbness to hands and feet.  Neck pain EXAM: CT CERVICAL SPINE WITHOUT CONTRAST TECHNIQUE: Multidetector CT imaging of the cervical spine was performed without intravenous contrast. Multiplanar CT image reconstructions were also generated. COMPARISON:  None. FINDINGS: Alignment: No subluxation.  Loss of cervical lordosis. Skull base and vertebrae: No acute fracture. No primary bone lesion or focal pathologic process. Soft tissues and spinal canal: No prevertebral fluid or swelling. No  visible canal hematoma. Disc levels:  Disc space narrowing at C5-6 and C6-7.  Mild spurring. 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 No acute bony abnormality. Electronically Signed   By: Charlett Nose M.D.   On: 03/06/2021 20:01    Procedures Procedures   Medications Ordered in ED Medications  dexamethasone (DECADRON) injection 10 mg (10 mg Intravenous Given 03/06/21 2005)    ED Course  I have reviewed the triage vital signs and the nursing notes.  Pertinent labs & imaging results that were available during my care of the patient were reviewed by me and considered in my medical decision making (see chart for details).    MDM Rules/Calculators/A&P                          50 year old male who presents for evaluation of numbness and tingling into bilateral hands, bilateral feet that have been ongoing for last 3 days.  Also endorses that he has had tremor of his left fourth digit that is been ongoing for about a month or so.  No fevers, weakness, chest pain, difficulty breathing.  Endorses some pain to the back of his neck which he states is new.  On initial arrival, he is afebrile, nontoxic-appearing.  Vital signs are stable.  He is good pulses in bilateral hands, feet.  Exam not concerning for ischemic limbs.  He does report some decrease sensation in bilateral hands, bilateral feet.  No weakness. Do not suspect CVA given his distribution of symptoms and the fact that it will intermittently occur. Question if there is a radiculopathy component to it.  Will obtain labs to look for electrolyte abnormalities as well as CT head to look for any intracranial mass.  CMP shows normal electrolytes.  CBC without any significant leukocytosis or anemia. CT head negative for any acute abnormality. CT cervical shows early degenerative changes in the cervical spine. No acute bony abnormality.  Discussed results with patient.  At this time,  patient with no weakness.  He is hemodynamically  stable.  He is able to ambulate without any difficulty.  Question of this is radiculopathy versus neuro etiology.  He does have a vitamin B12 and TSH pending.  At this time, patient stable for discharge.  Do not feel that patient warrants emergent MRI imaging as do not have concern for acute stroke.  We will plan for outpatient neurology follow-up.  Patient instructed to follow-up with his primary care doctor regarding his additional blood work. Discussed patient with Dr. Particia NearingHaviland who is agreeable to plan. At this time, patient exhibits no emergent life-threatening condition that require further evaluation in ED. Patient had ample opportunity for questions and discussion. All patient's questions were answered with full understanding. Strict return precautions discussed. Patient expresses understanding and agreement to plan.   Portions of this note were generated with Scientist, clinical (histocompatibility and immunogenetics)Dragon dictation software. Dictation errors may occur despite best attempts at proofreading.   Final Clinical Impression(s) / ED Diagnoses Final diagnoses:  Numbness    Rx / DC Orders ED Discharge Orders         Ordered    Ambulatory referral to Neurology       Comments: An appointment is requested in approximately: 1 week   03/06/21 2129           Rosana HoesLayden, Raine Blodgett A, PA-C 03/06/21 2205    Jacalyn LefevreHaviland, Julie, MD 03/06/21 [phone]

## 2021-03-06 NOTE — ED Triage Notes (Signed)
States numbness and tingling to hands and feet x 2 days, also states fingers tremmors and tingling.

## 2021-03-06 NOTE — Telephone Encounter (Addendum)
Eduardo Mata just called and said he is now having numbness in his feet and land hands, so [facility]

## 2021-03-06 NOTE — Discharge Instructions (Signed)
As we discussed, your work-up today looked reassuring.  You have a thyroid and B12 lab pending.  You can check on MyChart regarding this result.  Follow-up with your primary care doctor regarding this.  As we discussed, we have given you outpatient referral to neurology to follow-up with your symptoms.  Return to the emergency department for any weakness, worsening numbness, difficulty walking or any other worsening concerning symptoms.

## 2021-03-07 LAB — TSH: TSH: 1.859 u[IU]/mL (ref 0.350–4.500)

## 2021-03-07 LAB — VITAMIN B12: Vitamin B-12: 611 pg/mL (ref 180–914)

## 2021-03-10 ENCOUNTER — Encounter: Payer: Self-pay | Admitting: Cardiology

## 2021-03-10 DIAGNOSIS — R011 Cardiac murmur, unspecified: Secondary | ICD-10-CM | POA: Insufficient documentation

## 2021-03-10 NOTE — Progress Notes (Signed)
Cardiology Office Note   Date:  03/11/2021   ID:  Eduardo Mata, DOB 1971-05-26, MRN [id number]  PCP:  Margaree Mackintosh, MD  Cardiologist:   None Referring:  Margaree Mackintosh, MD  Chief Complaint  Patient presents with  . Numbness      History of Present Illness: Eduardo Mata is a 50 y.o. male who history of rheumatic heart disease and a heart murmur.  However, I did see the echo done recently that demonstrated no abnormalities.    He has not had a murmur since he was a teenager.  In 2013 I saw him and his echo was unremarkable.  He had COVID in January.  He did okay with that but he thinks he had some mild problems following that though he does not know that they are related.  He felt some gastric upset.  He has had some pain between his shoulder blades.  He has had sporadic numbness in his feet and arms.  His arms will be tingling.  It might wake him up.  He has to get up and move around for to go away.  He has some mild burning discomfort.  He has a tremor he is going to see a neurologist.  He is quite active.  He gets 12-15,000 steps a day.  He walks for exercise and does not have any problems with this.  He does have dyslipidemia and was recently started on Lipitor.  He is not describing any new shortness of breath, PND or orthopnea.  Has not been having any palpitations, presyncope or syncope.  Past Medical History:  Diagnosis Date  . Dyslipidemia   . GERD (gastroesophageal reflux disease)   . Rheumatic fever     Past Surgical History:  Procedure Laterality Date  . INGUINAL HERNIA REPAIR     bilateral     Current Outpatient Medications  Medication Sig Dispense Refill  . Ascorbic Acid (VITAMIN C) 500 MG CAPS See admin instructions.    . Cholecalciferol (VITAMIN D3 MAXIMUM STRENGTH) 125 MCG (5000 UT) capsule See admin instructions.    Marland Kitchen esomeprazole (NEXIUM) 20 MG capsule 1 capsule    . Multiple Vitamins-Minerals (MULTIVITAMIN ADULTS 50+ PO) 1 tablet    . omega-3 acid  ethyl esters (LOVAZA) 1 g capsule Take by mouth 2 (two) times daily.    . rosuvastatin (CRESTOR) 5 MG tablet Take 1 tablet (5 mg total) by mouth daily. 90 tablet 3   No current facility-administered medications for this visit.    Allergies:   Patient has no known allergies.    Social History:  The patient  reports that he has never smoked. He has never used smokeless tobacco. He reports current alcohol use. He reports that he does not use drugs.   Family History:  The patient's family history includes Barrett's esophagus in his father.    ROS:  Please see the history of present illness.   Otherwise, review of systems are positive for none.   All other systems are reviewed and negative.    PHYSICAL EXAM: VS:  BP 112/70 (BP Location: Left Arm, Patient Position: Sitting, Cuff Size: Normal)   Pulse 69   Ht 5\' 8"  (1.727 m)   Wt 166 lb (75.3 kg)   BMI 25.24 kg/m  , BMI Body mass index is 25.24 kg/m. GENERAL:  Well appearing HEENT:  Pupils equal round and reactive, fundi not visualized, oral mucosa unremarkable NECK:  No jugular venous distention, waveform within normal  limits, carotid upstroke brisk and symmetric, no bruits, no thyromegaly LYMPHATICS:  No cervical, inguinal adenopathy LUNGS:  Clear to auscultation bilaterally BACK:  No CVA tenderness CHEST:  Unremarkable HEART:  PMI not displaced or sustained,S1 and S2 within normal limits, no S3, no S4, no clicks, no rubs, no murmurs ABD:  Flat, positive bowel sounds normal in frequency in pitch, no bruits, no rebound, no guarding, no midline pulsatile mass, no hepatomegaly, no splenomegaly EXT:  2 plus pulses throughout, no edema, no cyanosis no clubbing SKIN:  No rashes no nodules NEURO:  Cranial nerves II through XII grossly intact, motor grossly intact throughout PSYCH:  Cognitively intact, oriented to person place and time    EKG:  EKG is ordered today. The ekg ordered today demonstrates sinus rhythm, rate 69, axis within  normal limits, intervals within normal limits, no acute ST-T wave changes.   Recent Labs: 03/06/2021: ALT 20; BUN 11; Creatinine, Ser 0.89; Hemoglobin 14.3; Platelets 192; Potassium 3.5; Sodium 136; TSH 1.859    Lipid Panel    Component Value Date/Time   CHOL 237 (H) 01/11/2021 0930   TRIG 183 (H) 01/11/2021 0930   HDL 37 (L) 01/11/2021 0930   CHOLHDL 6.4 (H) 01/11/2021 0930   LDLCALC 166 (H) 01/11/2021 0930      Wt Readings from Last 3 Encounters:  03/11/21 166 lb (75.3 kg)  03/06/21 161 lb (73 kg)  01/15/21 170 lb (77.1 kg)      Other studies Reviewed: Additional studies/ records that were reviewed today include: Echo ordered by Margaree Mackintosh, MD. Review of the above records demonstrates:  Please see elsewhere in the note.     ASSESSMENT AND PLAN:  History of rheumatic fever: He has no history of rheumatic heart disease.  No further work-up is indicated.  He has no murmur.  Dyslipidemia: He does have significantly elevated LDL at 166 with an HDL of 37.  There is some distant family history.  I am going to screen him with a coronary calcium score which will help set goals of therapy.  He was recently started on statin.  We talked about a plant-based diet.  We talked about exercise.   Current medicines are reviewed at length with the patient today.  The patient does not have concerns regarding medicines.  The following changes have been made:  no change  Labs/ tests ordered today include:   Orders Placed This Encounter  Procedures  . CT CARDIAC SCORING (SELF PAY ONLY)  . EKG 12-Lead     Disposition:   FU with me as needed   Signed, Rollene Rotunda, MD  03/11/2021 10:38 AM    [facility]

## 2021-03-11 ENCOUNTER — Encounter: Payer: Self-pay | Admitting: Cardiology

## 2021-03-11 ENCOUNTER — Emergency Department (HOSPITAL_BASED_OUTPATIENT_CLINIC_OR_DEPARTMENT_OTHER)
Admission: EM | Admit: 2021-03-11 | Discharge: 2021-03-12 | Disposition: A | Discharge status: Home / Self Care | Payer: 59 | Attending: Emergency Medicine | Admitting: Emergency Medicine

## 2021-03-11 ENCOUNTER — Encounter (HOSPITAL_BASED_OUTPATIENT_CLINIC_OR_DEPARTMENT_OTHER): Payer: Self-pay

## 2021-03-11 ENCOUNTER — Ambulatory Visit: Payer: 59 | Admitting: Cardiology

## 2021-03-11 ENCOUNTER — Other Ambulatory Visit: Payer: Self-pay

## 2021-03-11 VITALS — BP 112/70 | HR 69 | Ht 68.0 in | Wt 166.0 lb

## 2021-03-11 DIAGNOSIS — R202 Paresthesia of skin: Secondary | ICD-10-CM | POA: Insufficient documentation

## 2021-03-11 DIAGNOSIS — I00 Rheumatic fever without heart involvement: Secondary | ICD-10-CM

## 2021-03-11 DIAGNOSIS — Z20822 Contact with and (suspected) exposure to covid-19: Secondary | ICD-10-CM | POA: Diagnosis not present

## 2021-03-11 DIAGNOSIS — M546 Pain in thoracic spine: Secondary | ICD-10-CM | POA: Diagnosis not present

## 2021-03-11 DIAGNOSIS — R011 Cardiac murmur, unspecified: Secondary | ICD-10-CM | POA: Diagnosis not present

## 2021-03-11 DIAGNOSIS — M47812 Spondylosis without myelopathy or radiculopathy, cervical region: Secondary | ICD-10-CM | POA: Insufficient documentation

## 2021-03-11 DIAGNOSIS — E781 Pure hyperglyceridemia: Secondary | ICD-10-CM | POA: Diagnosis not present

## 2021-03-11 LAB — CBC WITH DIFFERENTIAL/PLATELET
Abs Immature Granulocytes: 0.02 10*3/uL (ref 0.00–0.07)
Basophils Absolute: 0 10*3/uL (ref 0.0–0.1)
Basophils Relative: 0 %
Eosinophils Absolute: 0.1 10*3/uL (ref 0.0–0.5)
Eosinophils Relative: 1 %
HCT: 43.7 % (ref 39.0–52.0)
Hemoglobin: 15 g/dL (ref 13.0–17.0)
Immature Granulocytes: 0 %
Lymphocytes Relative: 30 %
Lymphs Abs: 2 10*3/uL (ref 0.7–4.0)
MCH: 31.3 pg (ref 26.0–34.0)
MCHC: 34.3 g/dL (ref 30.0–36.0)
MCV: 91.2 fL (ref 80.0–100.0)
Monocytes Absolute: 0.5 10*3/uL (ref 0.1–1.0)
Monocytes Relative: 8 %
Neutro Abs: 4 10*3/uL (ref 1.7–7.7)
Neutrophils Relative %: 61 %
Platelets: 202 10*3/uL (ref 150–400)
RBC: 4.79 MIL/uL (ref 4.22–5.81)
RDW: 12.4 % (ref 11.5–15.5)
WBC: 6.6 10*3/uL (ref 4.0–10.5)
nRBC: 0 % (ref 0.0–0.2)

## 2021-03-11 LAB — COMPREHENSIVE METABOLIC PANEL
ALT: 19 U/L (ref 0–44)
AST: 18 U/L (ref 15–41)
Albumin: 4.6 g/dL (ref 3.5–5.0)
Alkaline Phosphatase: 78 U/L (ref 38–126)
Anion gap: 7 (ref 5–15)
BUN: 13 mg/dL (ref 6–20)
CO2: 28 mmol/L (ref 22–32)
Calcium: 9 mg/dL (ref 8.9–10.3)
Chloride: 104 mmol/L (ref 98–111)
Creatinine, Ser: 0.81 mg/dL (ref 0.61–1.24)
GFR, Estimated: >60 mL/min (ref >60)
Glucose, Bld: 88 mg/dL (ref 70–99)
Potassium: 3.5 mmol/L (ref 3.5–5.1)
Sodium: 139 mmol/L (ref 135–145)
Total Bilirubin: 0.9 mg/dL (ref 0.3–1.2)
Total Protein: 7.2 g/dL (ref 6.5–8.1)

## 2021-03-11 NOTE — ED Provider Notes (Signed)
[facility] EMERGENCY DEPARTMENT Provider Note   CSN: [id number] Arrival date & time: 03/11/21  1609     History Chief Complaint  Patient presents with  . Back Pain    Eduardo Mata is a 50 y.o. male with a history of symptomatic heart disease, GERD, dyslipidemia.  Patient presents with a chief complaint of tingling and burning sensation in hands and feet.  Patient reports that his symptoms have been present since approximately May 30.  Patient was seen in emergency department on 6/1 reports he had improvement in his symptoms after receiving steroid injection however symptoms came back the next day and have been worse since then.  Patient reports that exacerbation is in bilateral feet and radiates up to his thighs.  Patient states that the burning sensation is different from the "tingling sensation."  That he was experiencing earlier.  Patient reports that these symptoms are worse at night and improves during the day.  Denies any weakness to bilateral lower legs.  Patient's tingling sensation is to bilateral hands.  Tingling is worse in his left hand specifically his left ring and pinky finger.  Tingling station is worse at night and improves during the day.  During the night tingling radiates up the entire left arm.  Patient also complains of midline thoracic back pain.  Patient reports that pain has been present just prior to tingling sensation began.  Patient reports that pain is constant however waxes and wanes in intensity.  Patient rates pain 6/10 on the pain scale at present.  Patient denies any alleviating or aggravating factors.  Patient has had no improvement with heat or ibuprofen.  Patient denies any fevers, chills, neck stiffness, neck pain, bowel or bladder dysfunction, saddle anesthesia, weakness to extremities, facial symmetry, slurred speech, visual disturbance, headaches.  Patient denies any recent falls or injuries.  HPI     Past Medical History:  Diagnosis  Date  . Dyslipidemia   . GERD (gastroesophageal reflux disease)   . Rheumatic fever     Patient Active Problem List   Diagnosis Date Noted  . Murmur 03/10/2021  . Fatigue 11/05/2011  . Rheumatic heart disease   . Hypertriglyceridemia     Past Surgical History:  Procedure Laterality Date  . INGUINAL HERNIA REPAIR     bilateral       Family History  Problem Relation Age of Onset  . Barrett's esophagus Father     Social History   Tobacco Use  . Smoking status: Never Smoker  . Smokeless tobacco: Never Used  Substance Use Topics  . Alcohol use: Yes    Comment: occasionally   . Drug use: No    Home Medications Prior to Admission medications   Medication Sig Start Date End Date Taking? Authorizing Provider  Ascorbic Acid (VITAMIN C) 500 MG CAPS See admin instructions.    [provider]  Cholecalciferol (VITAMIN D3 MAXIMUM STRENGTH) 125 MCG (5000 UT) capsule See admin instructions.    [provider]  esomeprazole (NEXIUM) 20 MG capsule 1 capsule    [provider]  Multiple Vitamins-Minerals (MULTIVITAMIN ADULTS 50+ PO) 1 tablet    [provider]  omega-3 acid ethyl esters (LOVAZA) 1 g capsule Take by mouth 2 (two) times daily.    [provider]  rosuvastatin (CRESTOR) 5 MG tablet Take 1 tablet (5 mg total) by mouth daily. 01/15/21   Margaree Mackintosh, MD    Allergies    Patient has no known allergies.  Review of Systems   Review of Systems  Constitutional: Negative for chills and fever.  Eyes: Negative for visual disturbance.  Respiratory: Negative for shortness of breath.   Cardiovascular: Negative for chest pain.  Gastrointestinal: Negative for abdominal pain, nausea and vomiting.  Genitourinary: Negative for difficulty urinating.  Musculoskeletal: Positive for back pain. Negative for neck pain and neck stiffness.  Skin: Negative for color change and rash.  Neurological: Positive for numbness. Negative for  dizziness, tremors, seizures, syncope, facial asymmetry, speech difficulty, weakness, light-headedness and headaches.  Psychiatric/Behavioral: Negative for confusion.    Physical Exam Updated Vital Signs BP 111/79   Pulse 73   Temp 98.7 F (37.1 C) (Oral)   Resp 16   Ht 5\' 8"  (1.727 m)   Wt 75.3 kg   SpO2 98%   BMI 25.24 kg/m   Physical Exam Vitals and nursing note reviewed.  Constitutional:      General: He is not in acute distress.    Appearance: He is not ill-appearing, toxic-appearing or diaphoretic.  HENT:     Head:     Jaw: No trismus or pain on movement.     Mouth/Throat:     Pharynx: Oropharynx is clear. Uvula midline. No pharyngeal swelling, oropharyngeal exudate, posterior oropharyngeal erythema or uvula swelling.  Eyes:     General: No scleral icterus.       Right eye: No discharge.        Left eye: No discharge.     Extraocular Movements: Extraocular movements intact.     Pupils: Pupils are equal, round, and reactive to light.  Cardiovascular:     Rate and Rhythm: Normal rate.     Pulses:          Radial pulses are 3+ on the right side and 3+ on the left side.       Dorsalis pedis pulses are 3+ on the right side and 3+ on the left side.  Pulmonary:     Effort: Pulmonary effort is normal.  Abdominal:     Palpations: Abdomen is soft.     Tenderness: There is no abdominal tenderness.  Musculoskeletal:     Cervical back: Normal range of motion and neck supple. No swelling, edema, deformity, erythema, signs of trauma, lacerations, rigidity, spasms, torticollis, tenderness, bony tenderness or crepitus. Normal range of motion.     Thoracic back: No swelling, edema, deformity, signs of trauma, lacerations, spasms, tenderness or bony tenderness. Normal range of motion.     Lumbar back: No swelling, edema, deformity, signs of trauma, lacerations, spasms, tenderness or bony tenderness. Normal range of motion.     Comments: Patient has sensation intact to all digits.   Cap refill less than 3 seconds in all digits.    Skin:    General: Skin is warm and dry.  Neurological:     General: No focal deficit present.     Mental Status: He is alert and oriented to person, place, and time.     GCS: GCS eye subscore is 4. GCS verbal subscore is 5. GCS motor subscore is 6.     Cranial Nerves: No cranial nerve deficit or facial asymmetry.     Sensory: Sensation is intact.     Motor: No weakness, tremor, seizure activity or pronator drift.     Coordination: Romberg sign negative. Finger-Nose-Finger Test normal.     Gait: Gait is intact. Gait normal.     Deep Tendon Reflexes:     Reflex Scores:  Patellar reflexes are 2+ on the right side and 2+ on the left side.      Achilles reflexes are 2+ on the right side and 2+ on the left side.    Comments: CN II-XII intact, equal grip strength, +5 strength to bilateral upper and lower extremities, sensation to light touch intact to bilateral upper and lower extremities    Psychiatric:        Behavior: Behavior is cooperative.     ED Results / Procedures / Treatments   Labs (all labs ordered are listed, but only abnormal results are displayed) Labs Reviewed - No data to display  EKG None  Radiology No results found.  Procedures Procedures   Medications Ordered in ED Medications - No data to display  ED Course  I have reviewed the triage vital signs and the nursing notes.  Pertinent labs & imaging results that were available during my care of the patient were reviewed by me and considered in my medical decision making (see chart for details).    MDM Rules/Calculators/A&P                          Alert 50 year old male no acute distress, nontoxic-appearing.  Patient presents with chief complaint of tingling and burning sensation to bilateral hands and feet.  He has had the symptoms is approximately 5/30.    Per chart review patient was seen at [facility] on 6/1.  Noncontrast CT of head and neck  showed no acute intracranial finding or acute bony abnormality.  On physical exam patient has no focal neurological deficit.  Due to patient's reported change in his symptoms we will reach out to neurology for consult.  2000 Spoke with with neurologist Dr. Amada Jupiter who recommended that patient be transferred to [facility] to have MRI of C-spine and brain with and without contrast.  If imaging is unremarkable patient is to follow-up with neurology in the outpatient setting.  2030 spoke with ED attending Dr. Audley Hose who agreed to accept the patient for transfer.   Final Clinical Impression(s) / ED Diagnoses Final diagnoses:  None    Rx / DC Orders ED Discharge Orders    None       Berneice Heinrich 03/12/21 0044    Tilden Fossa, MD 03/14/21 1446

## 2021-03-11 NOTE — ED Notes (Signed)
Report called to Elliot Gurney, Charity fundraiser, Press photographer at [facility] ED

## 2021-03-11 NOTE — Patient Instructions (Signed)
Medication Instructions:  Your physician recommends that you continue on your current medications as directed. Please refer to the Current Medication list given to you today.  Labwork: NONE  Testing/Procedures: CALCIUM SCORE, THIS WILL COST YOU $99 OUT OF POCKET [facility] AT [address]   Follow-Up: AS NEEDED

## 2021-03-11 NOTE — Progress Notes (Signed)
[facility]  PATIENT: Eduardo Mata DOB: 1970-11-27  REFERRING DOCTOR OR PCP: Marlan Palau, MD SOURCE: Patient, notes from the emergency room, laboratory and imaging reports, MRI and CT images personally reviewed  _________________________________   HISTORICAL  CHIEF COMPLAINT:  Chief Complaint  Patient presents with  . New Patient (Initial Visit)    RM 12, alone. Internal referral for numbness/tingling bilateral hands and feet. Sx started about a week and a half ago. Was mostly at night first. Has some finger twitching in both hands that started prior to numbness/tingling. Went to [facility] [facility]. He went last night and had MRI's completed    HISTORY OF PRESENT ILLNESS:   I had the pleasure of seeing patient, Maclane Holloran, at [facility] for neurologic consultation regarding his episodes of numbness over the past few weeks  He is a 50 year old man who has had episodic tingling in his legs > arms over the past 2 weeks.  He has gone to the emergency room twice due to the symptoms.  He notes tingling in the left arm from the elbow to the 4th and 5th fingers.    Onset of tingling is sometimes associated with activity.    He notes the sensation in his feet most frequently.   He does not note much pain, none in the arms (just tingling) but occasionally has mild burning in his legs.   He is not noting weakness.  The symptoms have not affected his gait.  No bladder dysfunction.  Due to the symptoms, he went to the emergency room first on 03/06/2021.  CT scan of the head with CT angiogram was essentially normal except for some calcification in the right cerebellar hemisphere (cannot explain symptoms).  He presented again to the [facility] emergency room last night and was transferred to [facility] to have urgent MRI of the brain, cervical spine and thoracic spine.  The spinal cord was normal.  He had a few T2 hyperintense foci in the brain that were  nonspecific.  He did have some spinal stenosis, that was moderate at C6-C7.  He has also noted tremors in his fingers, left > right.    This occurs more when his fingers are spread.   His grandfather had Parkinson's and he has some concern.    He has a shaky iternal sensation at times.  He has some lower and mid back pain.    In 2014, he had sciatica and had a lumbar MRI showing disc protrusions   Pain shot into one of his legs to the bottom of his foot.Symptoms improved with PT.   Currently, he is just experiencing numbness in the bottom of both feet.    He denies weakness now or in te past month.  Gait is unchanged.    He had vertigo 30 years ago and notes balance is often mildly off.   He can go downstairs without the bannister.   However, if he shuts his eyes he feels off balanced (ongoing x years).     He has high triglycerides (on Crestor) but no HTN, DM or h/o smoking.    He is going to have a calcium score CT soon.   He tries to eat well and walks 33295 steps most days.      IMAGING (2022 images and select 2014 images personally reviewed): CT scan of the head 03/06/2021 shows calcifications in the right cerebellar hemisphere.   No acute findings  MRI of the head  03/12/2021 shows several scattered T2/FLAIR hyperintense foci, predominantly in the subcortical white matter.  None of these appear to be acute.  They do not enhance.  There is an expanded Virchow-Robin space versus chronic focus of ischemia or inflammation in the left cerebellar hemisphere near the fourth ventricle.  It does not enhance.  No pathology associated with the dystrophic calcification noted on the CT scan in the right cerebellar hemisphere.  MRI of the cervical and thoracic spine 03/12/2021 shows the spinal cord appears normal.  There are degenerative changes from C3-C4 through C6-C7.  This leads to borderline spinal stenosis at C3-C4, C4-C5 and C5-C6 and moderate spinal stenosis at C6-C7.  There is moderate foraminal narrowing to  the left at C3-C4, mild bilateral at C5-C6 and moderate left and mild right at C6-C7.  However, there does not appear to be any nerve root compression.  MRI 2014 (showed a few images he took on his phone at Ortho office) shows a large right paramedian right L5S1 protrusion that could cause right S1 nerve root compression.     REVIEW OF SYSTEMS: Constitutional: No fevers, chills, sweats, or change in appetite Eyes: No visual changes, double vision, eye pain Ear, nose and throat: No hearing loss, ear pain, nasal congestion, sore throat Cardiovascular: No chest pain, palpitations Respiratory: No shortness of breath at rest or with exertion.   No wheezes GastrointestinaI: No nausea, vomiting, diarrhea, abdominal pain, fecal incontinence Genitourinary: No dysuria, urinary retention or frequency.  No nocturia. Musculoskeletal: No neck pain, back pain Integumentary: No rash, pruritus, skin lesions Neurological: as above Psychiatric: No depression at this time.  No anxiety Endocrine: No palpitations, diaphoresis, change in appetite, change in weigh or increased thirst Hematologic/Lymphatic: No anemia, purpura, petechiae. Allergic/Immunologic: No itchy/runny eyes, nasal congestion, recent allergic reactions, rashes  ALLERGIES: No Known Allergies  HOME MEDICATIONS:  Current Outpatient Medications:  .  Ascorbic Acid (VITAMIN C) 500 MG CAPS, See admin instructions., Disp: , Rfl:  .  Cholecalciferol (VITAMIN D3 MAXIMUM STRENGTH) 125 MCG (5000 UT) capsule, See admin instructions., Disp: , Rfl:  .  esomeprazole (NEXIUM) 20 MG capsule, 1 capsule, Disp: , Rfl:  .  Multiple Vitamins-Minerals (MULTIVITAMIN ADULTS 50+ PO), 1 tablet, Disp: , Rfl:  .  omega-3 acid ethyl esters (LOVAZA) 1 g capsule, Take by mouth 2 (two) times daily., Disp: , Rfl:  .  rosuvastatin (CRESTOR) 5 MG tablet, Take 1 tablet (5 mg total) by mouth daily., Disp: 90 tablet, Rfl: 3  PAST MEDICAL HISTORY: Past Medical History:   Diagnosis Date  . Dyslipidemia   . GERD (gastroesophageal reflux disease)   . Rheumatic fever     PAST SURGICAL HISTORY: Past Surgical History:  Procedure Laterality Date  . INGUINAL HERNIA REPAIR     bilateral    FAMILY HISTORY: Family History  Problem Relation Age of Onset  . Barrett's esophagus Father   . Parkinson's disease Maternal Grandfather     SOCIAL HISTORY:  Social History   Socioeconomic History  . Marital status: Married    Spouse name: Not on file  . Number of children: 2  . Years of education: Not on file  . Highest education level: Not on file  Occupational History  . Occupation: IT     Employer: [employer]  Tobacco Use  . Smoking status: Never Smoker  . Smokeless tobacco: Never Used  Substance and Sexual Activity  . Alcohol use: Yes    Comment: occasionally   . Drug use: No  .  Sexual activity: Not on file  Other Topics Concern  . Not on file  Social History Narrative   Lives w/ wife   Caffeine use: coffee daily (1 cup), soda sometimes   Married.     Right handed   Social Determinants of Health   Financial Resource Strain: Not on file  Food Insecurity: Not on file  Transportation Needs: Not on file  Physical Activity: Not on file  Stress: Not on file  Social Connections: Not on file  Intimate Partner Violence: Not on file     PHYSICAL EXAM  Vitals:   03/12/21 0750  BP: 112/68  Pulse: 71  Weight: 165 lb 8 oz (75.1 kg)  Height: 5\' 8"  (1.727 m)    Body mass index is 25.16 kg/m.   General: The patient is well-developed and well-nourished and in no acute distress  HEENT:  Head is [place]/AT.  Sclera are anicteric.   Neck: No carotid bruits are noted.  The neck is nontender.  Cardiovascular: The heart has a regular rate and rhythm with a normal S1 and S2. There were no murmurs, gallops or rubs.    Skin: Extremities are without rash or  edema.  Musculoskeletal:  Back is nontender  Neurologic Exam  Mental status: The  patient is alert and oriented x 3 at the time of the examination. The patient has apparent normal recent and remote memory, with an apparently normal attention span and concentration ability.   Speech is normal.  Cranial nerves: Extraocular movements are full. Pupils are equal, round, and reactive to light and accomodation. Symmetric color vision  Facial symmetry is present. There is good facial sensation to soft touch bilaterally.Facial strength is normal.  Trapezius and sternocleidomastoid strength is normal. No dysarthria is noted.  The tongue is midline, and the patient has symmetric elevation of the soft palate. No obvious hearing deficits are noted.  Motor:  Muscle bulk is normal.   Tone is normal. Strength is  4+/5 in ulnar innervated left hand muscles.   5/5 elsewhere in arms and in legs/feet  Sensory: Reduced sensation to pinprick in left ulnar distribution.   Positive ulnar Tinel's at both elbows.   Negative median Tinel's.  Normal pinprick but slightly reduced vibration sensation at toes (70%), normal at ankles.  Coordination: Cerebellar testing reveals good finger-nose-finger and heel-to-shin bilaterally.  Gait and station: Station is normal.   Gait is normal. Tandem gait is normal. Romberg is negative.   Reflexes: Deep tendon reflexes are symmetric and normal bilaterally.   Plantar responses are flexor.    DIAGNOSTIC DATA (LABS, IMAGING, TESTING) - I reviewed patient records, labs, notes, testing and imaging myself where available.  Lab Results  Component Value Date   WBC 6.6 03/11/2021   HGB 15.0 03/11/2021   HCT 43.7 03/11/2021   MCV 91.2 03/11/2021   PLT 202 03/11/2021      Component Value Date/Time   NA 139 03/11/2021 2016   K 3.5 03/11/2021 2016   CL 104 03/11/2021 2016   CO2 28 03/11/2021 2016   GLUCOSE 88 03/11/2021 2016   BUN 13 03/11/2021 2016   CREATININE 0.81 03/11/2021 2016   CREATININE 0.98 01/11/2021 0930   CALCIUM 9.0 03/11/2021 2016   PROT 7.2  03/11/2021 2016   ALBUMIN 4.6 03/11/2021 2016   AST 18 03/11/2021 2016   ALT 19 03/11/2021 2016   ALKPHOS 78 03/11/2021 2016   BILITOT 0.9 03/11/2021 2016   GFRNONAA >60 03/11/2021 2016   GFRNONAA 89 01/11/2021  0930   GFRAA 103 01/11/2021 0930   Lab Results  Component Value Date   CHOL 237 (H) 01/11/2021   HDL 37 (L) 01/11/2021   LDLCALC 166 (H) 01/11/2021   TRIG 183 (H) 01/11/2021   CHOLHDL 6.4 (H) 01/11/2021   No results found for: HGBA1C Lab Results  Component Value Date   VITAMINB12 611 03/06/2021   Lab Results  Component Value Date   TSH 1.859 03/06/2021       ASSESSMENT AND PLAN  Numbness - Plan: NCV with EMG(electromyography)  Neuritis of left ulnar nerve  Lumbar radiculopathy  Cervical spinal stenosis   In summary, Mr. Hur is a 50 year old man with intermittent episodes of numbness.  He does not appear to have a polyneuropathy on his examination and reflexes were not pathologic.  There is numbness and weakness in the left hand in the distribution of the ulnar nerve and he also has a Tinel's sign at the elbow.  This is consistent with an ulnar neuropathy.  He has a Tinel's sign without fixed numbness or weakness in the right hand.  Examination of the legs was normal.  The MRI from 2014 showed potential for right S1 nerve root compression and it is possible that these degenerative changes have worsened.  Symptoms are mild and are not that uncomfortable.  Therefore, I will hold off on adding a medication at this time but we did discuss prescribing gabapentin if symptoms become more uncomfortable.  We will check an NCV/EMG study of both arms and the left leg to further evaluate the numbness.  If leg pain worsens and he has evidence of radiculopathy, we may need to check a lumbar MRI.  He was concerned about the possibility of Parkinson's disease.  I noted no tremor or bradykinesia and he has normal muscle tone and gait.  Therefore, Parkinson's disease is  extremely unlikely.  I will see him when he returns for the NCV/EMG study.  He should call sooner if he has any major new or worsening symptoms.  Thank you for asking me to see Mr. Natter.  Please let me know can be of further assistance with him or other patients in the future.  Sylvanna Burggraf A. Epimenio Foot, MD, [facility] 03/12/2021, 8:04 AM Certified in Neurology, Clinical Neurophysiology, Sleep Medicine and Neuroimaging  [facility] [address], [address] [place], [place] [address] [phone]

## 2021-03-11 NOTE — ED Notes (Signed)
Patient arrives via [facility], NAD at this time.  Patient to have MRI.

## 2021-03-11 NOTE — ED Triage Notes (Signed)
Pt arrives for continued back pain and burning in hands and feet, worsening when sleeping, has been in touch with neurology for follow up but concerned over worsening back pain.

## 2021-03-11 NOTE — ED Provider Notes (Signed)
[facility] EMERGENCY DEPARTMENT Provider Note   CSN: [id number] Arrival date & time: 03/11/21  1609     History Chief Complaint  Patient presents with  . Back Pain    Eduardo Mata is a 50 y.o. male presents to the Emergency Department as a transfer from [facility] complaining of intermittent numbness and tingling in his hands and feet for the last week.  Pt reports tingling in the feet has been persistent, but other tingling comes and goes.  Pt reports his symptoms are worst at night and is improved with movement.  He does not have arm/leg tingling during the day.  Associated burning in his feet. C/o of midline thoracic back pain without known trauma.   Pt was discussed with Dr. Amada Jupiter who recommended txfr to [facility] for MRI of head and cervical spine w and wo.  The history is provided by the patient and medical records. No language interpreter was used.       Past Medical History:  Diagnosis Date  . Dyslipidemia   . GERD (gastroesophageal reflux disease)   . Rheumatic fever     Patient Active Problem List   Diagnosis Date Noted  . Murmur 03/10/2021  . Fatigue 11/05/2011  . Rheumatic heart disease   . Hypertriglyceridemia     Past Surgical History:  Procedure Laterality Date  . INGUINAL HERNIA REPAIR     bilateral       Family History  Problem Relation Age of Onset  . Barrett's esophagus Father     Social History   Tobacco Use  . Smoking status: Never Smoker  . Smokeless tobacco: Never Used  Substance Use Topics  . Alcohol use: Yes    Comment: occasionally   . Drug use: No    Home Medications Prior to Admission medications   Medication Sig Start Date End Date Taking? Authorizing Provider  Ascorbic Acid (VITAMIN C) 500 MG CAPS See admin instructions.    [provider]  Cholecalciferol (VITAMIN D3 MAXIMUM STRENGTH) 125 MCG (5000 UT) capsule See admin instructions.    [provider]  esomeprazole (NEXIUM) 20 MG  capsule 1 capsule    [provider]  Multiple Vitamins-Minerals (MULTIVITAMIN ADULTS 50+ PO) 1 tablet    [provider]  omega-3 acid ethyl esters (LOVAZA) 1 g capsule Take by mouth 2 (two) times daily.    [provider]  rosuvastatin (CRESTOR) 5 MG tablet Take 1 tablet (5 mg total) by mouth daily. 01/15/21   Margaree Mackintosh, MD    Allergies    Patient has no known allergies.  Review of Systems   Review of Systems  Constitutional: Negative for chills and fever.  HENT: Negative for voice change.   Eyes: Negative for visual disturbance.  Respiratory: Negative for chest tightness and shortness of breath.   Cardiovascular: Negative for chest pain.  Gastrointestinal: Negative for abdominal pain.  Musculoskeletal: Positive for back pain.  Skin: Negative for rash.  Allergic/Immunologic: Negative for immunocompromised state.  Neurological: Positive for numbness.  Hematological: Negative for adenopathy.  Psychiatric/Behavioral: The patient is nervous/anxious.     Physical Exam Updated Vital Signs BP 124/80 (BP Location: Right Arm)   Pulse 65   Temp 97.8 F (36.6 C) (Oral)   Resp 16   Ht 5\' 8"  (1.727 m)   Wt 75.3 kg   SpO2 100%   BMI 25.24 kg/m   Physical Exam Vitals and nursing note reviewed.  Constitutional:      General:  He is not in acute distress.    Appearance: He is well-developed.  HENT:     Head: Normocephalic.  Eyes:     General: No scleral icterus.    Conjunctiva/sclera: Conjunctivae normal.  Cardiovascular:     Rate and Rhythm: Normal rate.  Pulmonary:     Effort: Pulmonary effort is normal.  Musculoskeletal:        General: Normal range of motion.     Cervical back: Normal range of motion.     Comments: Moves all extremities without difficulty  Skin:    General: Skin is warm and dry.  Neurological:     Mental Status: He is alert.  Psychiatric:        Mood and Affect: Mood normal.     ED Results / Procedures / Treatments    Labs (all labs ordered are listed, but only abnormal results are displayed) Labs Reviewed  SARS CORONAVIRUS 2 (TAT 6-24 HRS)  CBC WITH DIFFERENTIAL/PLATELET  COMPREHENSIVE METABOLIC PANEL     Radiology MR Cervical Spine W or Wo Contrast  Result Date: 03/12/2021 CLINICAL DATA:  Initial evaluation for acute numbness, tingling, paresthesias in hands and feet. EXAM: MRI CERVICAL SPINE WITHOUT AND WITH CONTRAST TECHNIQUE: Multiplanar and multiecho pulse sequences of the cervical spine, to include the craniocervical junction and cervicothoracic junction, were obtained without and with intravenous contrast. CONTRAST:  7.73mL GADAVIST GADOBUTROL 1 MMOL/ML IV SOLN COMPARISON:  Prior CT from 03/06/2021. FINDINGS: Alignment: Straightening with mild reversal of the normal cervical lordosis. No listhesis. Vertebrae: Vertebral body height maintained without acute or chronic fracture. Bone marrow signal intensity within normal limits. No discrete or worrisome osseous lesions. No abnormal marrow edema or enhancement. Cord: Normal signal and morphology.  No abnormal enhancement. Posterior Fossa, vertebral arteries, paraspinal tissues: Craniocervical junction within normal limits. Paraspinous and prevertebral soft tissues normal. Normal flow voids seen within the vertebral arteries bilaterally. Disc levels: C2-C3: Unremarkable. C3-C4: Mild disc bulge with left greater than right uncovertebral hypertrophy. Flattening and partial effacement of the ventral thecal sac with resultant mild spinal stenosis. Moderate left C4 foraminal stenosis. Right neural foramina remains patent. C4-C5: Mild disc bulge with uncovertebral spurring. Flattening and partial effacement of the ventral thecal sac with resultant mild spinal stenosis. Foramina remain patent. C5-C6: Mild disc bulge with uncovertebral hypertrophy. Broad posterior disc osteophyte effaces the ventral thecal sac, contacting and mildly flattening the ventral cord, greater on  the right. Mild spinal stenosis. Moderate right worse than left C6 foraminal narrowing. C6-C7: Degenerative intervertebral disc space narrowing with diffuse disc osteophyte complex. Broad posterior component indents and partially faces the ventral thecal sac, asymmetric to the left. Mild cord flattening without cord signal changes. Moderate spinal stenosis. Moderate left with mild right C7 foraminal narrowing. C7-T1: Negative interspace. Left-sided facet degeneration with associated joint effusion. No spinal stenosis. Foramina remain patent. IMPRESSION: 1. Normal MRI appearance of the cervical spinal cord. No evidence for demyelinating disease or other abnormality. 2. Multilevel cervical spondylosis with resultant mild to moderate diffuse spinal stenosis at C3-4 through C6-7, most pronounced at C6-7. Associated mild to moderate bilateral C4 through C7 foraminal stenosis as above. Electronically Signed   By: Rise Mu M.D.   On: 03/12/2021 03:38    Procedures Procedures   Medications Ordered in ED Medications  gadobutrol (GADAVIST) 1 MMOL/ML injection 7.5 mL (7.5 mLs Intravenous Contrast Given 03/12/21 0318)    ED Course  I have reviewed the triage vital signs and the nursing notes.  Pertinent labs &  imaging results that were available during my care of the patient were reviewed by me and considered in my medical decision making (see chart for details).    MDM Rules/Calculators/A&P                           Pt presents as transfer from [facility] for MRI brain and spine.    5:22 AM MRI brain/T spine not crossing over.  I have reviewed the images and report in PACS.  Brain with several small areas of T2/ flair.  Cervical spine with spondylosis and stenosis likely the cause of patient's symptoms.  Discussed with and images reviewed by neurology, Dr. Derry Lory.  He recommends outpatient conduction study for further evaluation and referral for physical therapy.    Discussed findings with  patient.  Given outpatient resources.  Discussed reasons return to the emergency department.  States understanding and is in agreement with the plan.   Final Clinical Impression(s) / ED Diagnoses Final diagnoses:  Paresthesias  Cervical spondylosis    Rx / DC Orders ED Discharge Orders    None       Anthany Thornhill, Boyd Kerbs 03/12/21 0524    Melene Plan, DO 03/12/21 [phone]

## 2021-03-12 ENCOUNTER — Encounter: Payer: Self-pay | Admitting: Neurology

## 2021-03-12 ENCOUNTER — Ambulatory Visit: Payer: 59 | Admitting: Neurology

## 2021-03-12 ENCOUNTER — Emergency Department (HOSPITAL_COMMUNITY): Payer: 59

## 2021-03-12 VITALS — BP 112/68 | HR 71 | Ht 68.0 in | Wt 165.5 lb

## 2021-03-12 DIAGNOSIS — R200 Anesthesia of skin: Secondary | ICD-10-CM

## 2021-03-12 DIAGNOSIS — M4802 Spinal stenosis, cervical region: Secondary | ICD-10-CM | POA: Diagnosis not present

## 2021-03-12 DIAGNOSIS — G5622 Lesion of ulnar nerve, left upper limb: Secondary | ICD-10-CM

## 2021-03-12 DIAGNOSIS — M5416 Radiculopathy, lumbar region: Secondary | ICD-10-CM | POA: Diagnosis not present

## 2021-03-12 LAB — SARS CORONAVIRUS 2 (TAT 6-24 HRS): SARS Coronavirus 2: NEGATIVE

## 2021-03-12 MED ORDER — GADOBUTROL 1 MMOL/ML IV SOLN
7.5000 mL | Freq: Once | INTRAVENOUS | Status: AC | PRN
  Administered 2021-03-12: 7.5 mL via INTRAVENOUS

## 2021-03-12 NOTE — ED Notes (Signed)
Patient transported to MRI 

## 2021-03-12 NOTE — Discharge Instructions (Addendum)
Your MRI showed cervical spondylosis and spinal stenosis at C3-4 through C6-7.  This is likely the cause of your symptoms.  You will need to have outpatient neurology nerve conduction studies to determine the best course of therapy including physical therapy.  1. Medications: Tylenol or ibuprofen for pain, usual home medications 2. Treatment: rest, drink plenty of fluids,  3. Follow Up: Please followup with your primary doctor in 3-5 days for discussion of your diagnoses and further evaluation after today's visit; if you do not have a primary care doctor use the resource guide provided to find one; Please return to the ER for persistent paresthesias, weakness, loss of bowel or bladder control or other concerns.

## 2021-03-18 ENCOUNTER — Telehealth: Payer: Self-pay | Admitting: Internal Medicine

## 2021-03-18 NOTE — Telephone Encounter (Signed)
Eduardo Mata [phone]  Byan called about possibly having some anxiety with the numbness in his hands and feet. He is worried about it, he seen the neurologist and is having a test later this month, but would like to talk with you sometime this week. He is available every day but Friday.

## 2021-03-18 NOTE — Telephone Encounter (Signed)
Scheduled

## 2021-03-19 ENCOUNTER — Encounter: Payer: Self-pay | Admitting: Internal Medicine

## 2021-03-19 ENCOUNTER — Ambulatory Visit: Payer: 59 | Admitting: Internal Medicine

## 2021-03-19 ENCOUNTER — Other Ambulatory Visit: Payer: Self-pay

## 2021-03-19 VITALS — BP 120/80 | HR 92 | Temp 98.7°F | Ht 68.0 in | Wt 164.0 lb

## 2021-03-19 DIAGNOSIS — R202 Paresthesia of skin: Secondary | ICD-10-CM | POA: Diagnosis not present

## 2021-03-19 MED ORDER — LORAZEPAM 1 MG PO TABS: 1.0000 mg | ORAL_TABLET | Freq: Every day | ORAL | 1 refills | Status: AC

## 2021-03-19 NOTE — Progress Notes (Deleted)
   Subjective:    Patient ID: Eduardo Mata, male    DOB: [birth year], 50 y.o.   MRN: [id number]  HPI    Review of Systems     Objective:   Physical Exam        Assessment & Plan:

## 2021-03-25 ENCOUNTER — Telehealth: Payer: Self-pay | Admitting: Neurology

## 2021-03-25 MED ORDER — GABAPENTIN 300 MG PO CAPS: 300.0000 mg | ORAL_CAPSULE | Freq: Three times a day (TID) | ORAL | 5 refills | Status: AC

## 2021-03-25 NOTE — Telephone Encounter (Signed)
Called pt back. Relayed Dr. Bonnita Hollow recommendation. He is agreeable to plan. E-scribed to [id number] - [place], [place] - [address]

## 2021-03-25 NOTE — Telephone Encounter (Signed)
Called pt back. He is having a hard time sleeping. Pins/needles wakes him up at night. Hard to go back to sleep. PCP rx'd lorazepam last week. He has taken twice and it has been helpful. He is wondering if he should take this with EMG/NCS coming up on 04/02/21. Wondering if he should try gabapentin instead that Dr. Epimenio Foot recommended? He now has more of a burning pain. Aware MD out until tomorrow. Will review then and we will call him back w/ recommendation.

## 2021-03-25 NOTE — Telephone Encounter (Signed)
Pt called needing to discuss sleep medications that will help with his pins and needles pain that he has at night. Please advise.

## 2021-04-02 ENCOUNTER — Telehealth: Payer: Self-pay

## 2021-04-02 ENCOUNTER — Ambulatory Visit (INDEPENDENT_AMBULATORY_CARE_PROVIDER_SITE_OTHER): Payer: 59 | Admitting: Neurology

## 2021-04-02 ENCOUNTER — Encounter: Payer: 59 | Admitting: Neurology

## 2021-04-02 ENCOUNTER — Other Ambulatory Visit: Payer: Self-pay

## 2021-04-02 ENCOUNTER — Encounter: Payer: Self-pay | Admitting: Neurology

## 2021-04-02 ENCOUNTER — Ambulatory Visit: Payer: 59 | Admitting: Neurology

## 2021-04-02 DIAGNOSIS — R200 Anesthesia of skin: Secondary | ICD-10-CM | POA: Insufficient documentation

## 2021-04-02 DIAGNOSIS — M4802 Spinal stenosis, cervical region: Secondary | ICD-10-CM | POA: Diagnosis not present

## 2021-04-02 DIAGNOSIS — Z0289 Encounter for other administrative examinations: Secondary | ICD-10-CM

## 2021-04-02 NOTE — Telephone Encounter (Signed)
Referral sent to [facility]. P: [phone].

## 2021-04-02 NOTE — Progress Notes (Signed)
Full Name: Eduardo Mata Gender: Male MRN #: [id number] Date of Birth: Sep 10, 1971    Visit Date: 04/02/2021 08:07 Age: 50 Years Examining Physician: Despina Arias, MD  Referring Physician: Despina Arias, MD   History: Mr. Eduardo Mata is a 50 year old man with intermittent episodes of numbness in his arms and legs.  Strength is  4+/5 in ulnar innervated left hand muscles.   5/5 elsewhere in arms and in legs/feet  He has reduced sensation to pinprick in left ulnar distribution.   Positive ulnar Tinel's at both elbows.   Negative median Tinel's.  Normal pinprick but slightly reduced vibration sensation at toes (70%), normal at ankles..  Deep tendon reflexes are symmetric and normal bilaterally.The MRI from 2014 showed potential for right S1 nerve root compression and it is possible that these degenerative changes have worsened.  Nerve conduction studies: The left median, ulnar, peroneal and tibial motor responses had normal distal latencies, amplitudes and conduction velocities.  F-wave latencies were normal.  The left median, ulnar, sural and superficial peroneal sensory responses had normal peak latencies and amplitudes.  The galvanic sympathetic skin response was normal.  Electromyography: Needle EMG of selected muscles of the left arm was performed.  There was mild chronic denervation in several left C7 innervated muscles tested.  Some C5/C6 innervated muscles had a few polyphasic motor units without neurogenic recruitment.  There was no abnormal spontaneous activity in any of the muscles tested..  Needle EMG of selected muscles of the left leg was performed.  There was no significant acute or chronic denervation noted.  2 of the S1 innervated muscles had a few polyphasic motor units but normal recruitment.  There was no abnormal spontaneous activity in any of the muscles tested.  Impression: This NCV/EMG study shows the following: 1.  Mild left chronic C7 radiculopathy without active  features.   2.  No evidence of mononeuropathy in the left arm or leg or polyneuropathy.  Eduardo Mata A. Eduardo Foot, MD, PhD, FAAN Certified in Neurology, Clinical Neurophysiology, Sleep Medicine, Pain Medicine and Neuroimaging Director, [facility]  [facility] [address], [address] [place], [place] [address] [phone]  Clinical note: We discussed options.  He will increase the gabapentin dose to 300 mg 3 times a day and begin physical therapy.  -- RAS   Verbal informed consent was obtained from the patient, patient was informed of potential risk of procedure, including bruising, bleeding, hematoma formation, infection, muscle weakness, muscle pain, numbness, among others.        MNC    Nerve / Sites Muscle Latency Ref. Amplitude Ref. Rel Amp Segments Distance Velocity Ref. Area    ms ms mV mV %  cm m/s m/s mVms  L Median - APB     Wrist APB 3.5 ?4.4 7.6 ?4.0 100 Wrist - APB 7   40.2     Upper arm APB 7.2  7.5  98.7 Upper arm - Wrist 21 56 ?49 38.7  L Ulnar - ADM     Wrist ADM 2.8 ?3.3 9.4 ?6.0 100 Wrist - ADM 7   48.2     B.Elbow ADM 6.3  8.5  90.8 B.Elbow - Wrist 20 57 ?49 46.5     A.Elbow ADM 8.1  8.4  98.7 A.Elbow - B.Elbow 10 57 ?49 46.3  L Peroneal - EDB     Ankle EDB 3.0 ?6.5 9.5 ?2.0 100 Ankle - EDB 9   29.7  Fib head EDB 9.4  8.3  87.4 Fib head - Ankle 29 45 ?44 27.6     Pop fossa EDB 11.6  8.1  98.5 Pop fossa - Fib head 10 45 ?44 27.4         Pop fossa - Ankle      L Tibial - AH     Ankle AH 3.9 ?5.8 19.2 ?4.0 100 Ankle - AH 9   40.7     Pop fossa AH 11.9  15.2  79.1 Pop fossa - Ankle 36 45 ?41 38.3             SSR    Nerve / Sites Latency   s  L Sympathetic - Mata     Mata 2.08            SNC    Nerve / Sites Rec. Site Peak Lat Ref.  Amp Ref. Segments Distance Peak Diff Ref.    ms ms V V  cm ms ms  L Sural - Ankle (Calf)     Calf Ankle 3.8 ?4.4 13 ?6 Calf - Ankle 14    L Superficial  peroneal - Ankle     Lat leg Ankle 3.8 ?4.4 7 ?6 Lat leg - Ankle 14    L Median, Ulnar - Transcarpal comparison     Median Palm Wrist 2.1 ?2.2 65 ?35 Median Palm - Wrist 8       Ulnar Palm Wrist 2.1 ?2.2 40 ?12 Ulnar Palm - Wrist 8          Median Palm - Ulnar Palm  0.0 ?0.4  L Median - Orthodromic (Dig II, Mid palm)     Dig II Wrist 3.0 ?3.4 20 ?10 Dig II - Wrist 13    L Ulnar - Orthodromic, (Dig V, Mid palm)     Dig V Wrist 2.7 ?3.1 10 ?5 Dig V - Wrist 69                 F  Wave    Nerve F Lat Ref.   ms ms  L Tibial - AH 50.9 ?56.0  L Ulnar - ADM 29.3 ?32.0         EMG Summary Table    Spontaneous MUAP Recruitment  Muscle IA Fib PSW Fasc Other Amp Dur. Poly Pattern  L. Deltoid Normal None None None _______ Normal Normal 1+ Normal  L. Triceps brachii Normal None None None _______ Increased Increased 1+ Reduced  L. Biceps brachii Normal None None None _______ Normal Normal Normal Normal  L. Extensor digitorum communis Normal None None None _______ Increased Increased 1+ Reduced  L. Flexor carpi ulnaris Normal None None None _______ Normal Normal 1+ Normal  L. First dorsal interosseous Normal None None None _______ Normal Normal Normal Normal  L. Rhomboid major Normal None None None _______ Normal Normal Normal Normal  L. Flexor pollicis longus Normal None None None _______ Normal Increased 1+ Reduced  L. Vastus medialis Normal None None None _______ Normal Normal Normal Normal  L. Tibialis anterior Normal None None None _______ Normal Normal Normal Normal  L. Peroneus longus Normal None None None _______ Normal Normal 1+ Normal  L. Gastrocnemius (Medial head) Normal None None None _______ Normal Normal Normal Normal  L. Abductor hallucis Normal None None None _______ Normal Normal 1+ Normal  L. Gluteus medius Normal None None None _______ Normal Normal Normal Normal  L. Iliopsoas Normal None None None _______ Normal Normal Normal Normal

## 2021-04-09 ENCOUNTER — Other Ambulatory Visit: Payer: Self-pay

## 2021-04-09 ENCOUNTER — Ambulatory Visit (INDEPENDENT_AMBULATORY_CARE_PROVIDER_SITE_OTHER)
Admission: RE | Admit: 2021-04-09 | Discharge: 2021-04-09 | Disposition: A | Discharge status: Home / Self Care | Payer: Self-pay | Source: Ambulatory Visit | Attending: Cardiology | Admitting: Cardiology

## 2021-04-09 DIAGNOSIS — E781 Pure hyperglyceridemia: Secondary | ICD-10-CM

## 2021-04-16 ENCOUNTER — Other Ambulatory Visit: Payer: 59 | Admitting: Internal Medicine

## 2021-04-16 ENCOUNTER — Other Ambulatory Visit: Payer: Self-pay

## 2021-04-16 DIAGNOSIS — E781 Pure hyperglyceridemia: Secondary | ICD-10-CM

## 2021-04-16 DIAGNOSIS — E782 Mixed hyperlipidemia: Secondary | ICD-10-CM

## 2021-04-16 LAB — LIPID PANEL
Cholesterol: 168 mg/dL (ref <200)
HDL: 48 mg/dL (ref >=40)
LDL Cholesterol (Calc): 93 mg/dL (calc)
Non-HDL Cholesterol (Calc): 120 mg/dL (calc) (ref <130)
Total CHOL/HDL Ratio: 3.5 (calc) (ref <5.0)
Triglycerides: 177 mg/dL — ABNORMAL HIGH (ref <150)

## 2021-04-16 LAB — HEPATIC FUNCTION PANEL
AG Ratio: 1.9 (calc) (ref 1.0–2.5)
ALT: 29 U/L (ref 9–46)
AST: 22 U/L (ref 10–35)
Albumin: 4.8 g/dL (ref 3.6–5.1)
Alkaline phosphatase (APISO): 81 U/L (ref 35–144)
Bilirubin, Direct: 0.1 mg/dL (ref 0.0–0.2)
Globulin: 2.5 g/dL (calc) (ref 1.9–3.7)
Indirect Bilirubin: 0.6 mg/dL (calc) (ref 0.2–1.2)
Total Bilirubin: 0.7 mg/dL (ref 0.2–1.2)
Total Protein: 7.3 g/dL (ref 6.1–8.1)

## 2021-04-19 ENCOUNTER — Other Ambulatory Visit: Payer: Self-pay

## 2021-04-19 ENCOUNTER — Encounter: Payer: Self-pay | Admitting: Internal Medicine

## 2021-04-19 ENCOUNTER — Ambulatory Visit: Payer: 59 | Admitting: Internal Medicine

## 2021-04-19 VITALS — BP 110/70 | HR 63 | Ht 68.0 in | Wt 169.0 lb

## 2021-04-19 DIAGNOSIS — R202 Paresthesia of skin: Secondary | ICD-10-CM

## 2021-04-19 DIAGNOSIS — Z8616 Personal history of COVID-19: Secondary | ICD-10-CM

## 2021-04-19 DIAGNOSIS — M4802 Spinal stenosis, cervical region: Secondary | ICD-10-CM | POA: Diagnosis not present

## 2021-04-19 DIAGNOSIS — E782 Mixed hyperlipidemia: Secondary | ICD-10-CM

## 2021-04-19 DIAGNOSIS — G5622 Lesion of ulnar nerve, left upper limb: Secondary | ICD-10-CM

## 2021-04-19 NOTE — Progress Notes (Addendum)
Subjective:    Patient ID: Eduardo Mata, male    DOB: 1971-07-28, 50 y.o.   MRN: [id number]  HPI 50 year old Male seen for follow-up.  History of Rheumatic fever diagnosed in 30 when he was 50 years old.  He still had concerns about this, saw Dr. Antoine Poche in January 2013 for cardiac evaluation.  2D echocardiogram showed normal mitral valve and trivial mitral regurgitation.  History of hyperlipidemia treated with Tricor but changed him to Crestor 5 mg daily because he had mixed hyperlipidemia.  He saw Dr. Antoine Poche on June 6,2022 and had EKG interpreted is normal. Patient wanted to know if history of Rheumatic fever had anything to do with current symptoms. He has intermittent paresthesias in upper and lower extremities at night around the time he is falling asleep.  He had COVID in January 2022.  Feels that he has had some odd sensations particularly some sporadic numbness in feet and arms.  Sometimes,these seem to wake him up.    He went to see Neurologist, Dr. Epimenio Foot, on June 7 as he had presented to the Emergency Department twice with symptoms of tingling in the legs and arms.  He had a CT scan of his head with CT angiogram which was essentially normal except for some calcifications in the right cerebellar hemisphere.  He presented to [facility] the evening before his evaluation by Dr. Epimenio Foot and had an urgent MRI of the brain, C-spine and thoracic spine.  The cord was normal.  He had a few T2 hyperintense foci in the brain that were nonspecific.  He did have some spinal stenosis that was moderate at C6-C7.  Was noted to have some tremors in fingers left hand greater than right.  He was concerned because his grandfather had Parkinson's disease.   Dr. Epimenio Foot felt that patient had ulnar neuropathy with some weakness in the left hand in the distribution of ulnar nerve and had a Tinel's sign at the elbow consistent with ulnar neuropathy.  He had Tinel's sign without fixed numbness or  weakness in his right hand.  Nerve conduction studies were normal.  He felt Parkinson's disease was unlikely.He has follow up with Dr. Epimenio Foot in September.  In 2014 he had sciatica and had lumbar MRI showing disc protrusions.  History of vertigo 30 years ago.   History of Mixed hyperlipidemia currently treated with Crestor.  Total cholesterol in April  on Tricor was 237, HDL 37, triglycerides 183 and LDL cholesterol 166. Subsequently was switched to Crestor in April with plans to follow up in a few months.  History of GE reflux treated with Dexilant in the past and more recently Nexium.  Review of Systems see above     Objective:   Physical Exam  His muscle strength in upper and lower extremities are normal.  Sensation is intact.  Affect thought and judgment appear to be normal.  He seems a bit anxious.      Assessment & Plan:  History of moderate left C4 stenosis on MRI June 2022  Mild spinal stenosis C4-C5  Mild disc bulge C5-C6  No evidence of demyelinating disease  Mild to moderate spinal stenosis C3-C4 and C6-C7  History of COVID-24 October 2020.  He is unvaccinated.He is concerned his  current symptoms are related to Covid-19 which he had in January 2022.  He had some anxiety status post having had COVID-19 in January 22.  Feels that there is some issues trying to fall asleep.  Have prescribed Ativan 1 mg at bedtime to see if symptoms improve.     Remote history of rheumatic fever as a child and has seen cardiologist in June.  EKG was normal.  Electrolytes were normal.  He is not anemic.  He has no murmur.  Dr. Antoine Poche felt no further Cardiology evaluation was necessary for rheumatic heart disease.  Cardiologist suggested patient  have coronary calcium score because of dyslipidemia and distant family history of coronary disease.  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   Plan: patient is  wondering where to go from here. Is somewhat better with sleep but still having paresthesias. He is wondering about long Covid and is this something seen with long Covid. I have not seen long Covid in my practice. Will see if he can be evaluated at [facility]

## 2021-04-21 NOTE — Patient Instructions (Addendum)
He will try lorazepam 1 mg at bedtime and see if symptoms improve.

## 2021-04-21 NOTE — Progress Notes (Addendum)
Subjective:    Patient ID: Eduardo Mata, male    DOB: Mar 05, 1971, 50 y.o.   MRN: [id number]  HPI  50 year old Male who works for [employer] in [employer] presents to office for the first time.  He stated he was taking Nexium over-the-counter for GE reflux.  History of Barrett's esophagus and reflux followed by Dr. Michail Sermon at [facility].  Had been treated with [facility] and Dexilant in the past.  History of constipation.  Had seen Dr. Fanny Skates in the past for internal hemorrhoids which were injected with a sclerosing solution in 2013.  He had COVID-19 in  January 2022.  Tested positive at work January 7th per document provided by EMS. He went to the emergency department January 19.  He told ED physician that he had myalgias, headache, sore throat and lost of taste and smell and nonbloody diarrhea with nausea.  Apparently had some diarrhea and upper respiratory symptoms.  He received received some IV fluids.  MI was ruled out.  EKG was within normal limits.  Chest x-ray was negative.   Subsequently had a telemedicine visit with [facility]  provider on October 30, 2020 complaining of fatigue, anxious, waking up at night with some vague odd feelings but no shortness of breath or palpitations.  Was told that some individuals would have symptoms beyond 2 weeks of having COVID.  Patient has not been vaccinated for COVID-19.    Recent labs April 8th show total cholesterol 237, HDL 37, triglycerides 183 and LDL 166.    Previously had been treated with Tricor for hypertriglyceridemia.  Tricor will be discontinued as he now has mixed hyperlipidemia. He will be treated with Crestor.  He has appt with Dr Raul Del in [facility]  on April 13 regarding right inguinal discomfort and bulge.  History of open left inguinal hernia repair with mesh 1998.  Apparently had seen Dr. Serita Butcher in 2004 and ultrasound just showed some scar tissue.   Addendum: regarding hernias:  No obvious hernia was  found  on Dr. Brigitte Pulse evaluation April 13., 2022. Was sent for additional studies. On April 21, he had ultrasound of inguinal area showing right epididymis measuring 1 cm.  Had left varicocele.  No evidence for testicular mass or torsion.  Had small left hydrocele.  Had tiny cyst up to 3 mm in the left epididymis.  Also had CT of pelvis with contrast same day showing a tiny fat-containing right inguinal hernia.  Additional PMH: He initially saw Dr. Percival Spanish in 2013 for evaluation of his heart.  According to Dr. Rosezella Florida notes from 2013, he was diagnosed with rheumatic heart disease at age 80 and was treated with antibiotics.   Apparently was discharged from the [facility] with this history.  EKG showed unusual P wave axis, possible ectopic atrial rhythm with minimal voltage criteria for LVH and nonspecific T wave changes in 2013.  Had 2D echocardiogram in 2013 which was normal including mitral valve.  Trivial regurgitation noted.  No stenosis.  Tricuspid valve was normal with trivial regurgitation.  Aorta was normal and not dilated.  No defect noted in atrial septum.  Left atrium was normal.  Left ventricle was normal. Dr. Michelle Piper did not think he had sequela from Rheumatic Fever at that time.   Addendum: When he saw Dr. Percival Spanish on March 11, 2021, he was complaining of gastric upset, pain between his shoulder blades and sporadic numbness in his feet and arms.  This might wake him up.  If he got up and moved around it would go away.  He noticed a mild tremor and was referred to Neurologist.  He walks a great deal and does not have shortness of breath.  Denied palpitations syncope or presyncope.  History of rheumatic fever when he was 50 years old and had no sequela.  EKG was normal.  No murmur was appreciated.  Was not thought to have rheumatic heart disease.    A coronary calcium score was ordered for early July recommended by Dr. Percival Spanish who also agreed with statin therapy.  Does not smoke or use smokeless  tobacco.  Reports current alcohol use that is social.  Does not use drugs.  No known drug allergies.  Remote history of bilateral inguinal hernia repair  He has had recent extensive imaging studies  in June including CT of the head without contrast, CT of the C-spine, MRI of the C-spine, MRI of the brain with and without contrast, MRI of the T-spine.  Patient presented to the Emergency department June 1 and  also June 6 complaining of paresthesias.  Complaining that there was numbness and tingling hands and feet for 3 days.  Felt like these issues were worse from mid thighs all the way down with some burning sensation in his legs and some numbness in his feet.  Improved with walking.  Noticed some numbness in right arm distally.  Complained of tremor in left fourth and fifth digit.  Complained of some neck pain.  B12 and folate levels were normal.  CBC and c-Met were normal.  CT of the C-spine and head were unremarkable.  He went back to the Emergency department on June 6 the same day that he saw Dr. Percival Spanish complaining of paresthesias.  Had MRI of the brain and C-spine as well as T-spine.  Had few scattered subcentimeter foci of T2/FLAIR hyperintensity involving the deep and subcortical white matter on the left greater than the right frontal lobes, nonspecific but overall mild for age.  Considerations were sequelae of chronic small vessel ischemia, migrainous disorder or less likely demyelination.   He saw Dr. Felecia Shelling, Neurologist regarding numbness  on June 7.  Was not thought to have polyneuropathy.  There was some numbness and weakness in the left hand in the distribution of the ulnar nerve and had a Tinel's sign at the elbow consistent with ulnar neuropathy.  He had Tinel's sign without fixed numbness or weakness in the right hand.  He reviewed MRI from 2014 showing right S1 nerve root compression.  He did not feel patient had Parkinson's disease.  He recommended nerve conduction/EMG.  MRI C-spine,  brain and T-spine have not showed evidence of multiple sclerosis   Patient called here on June 13 complaining of anxiety with numbness in hands and feet.    He is having some issues with odd paresthesias when he is about to fall asleep.  This is been disconcerting for him and he would like to try something for sleep.  Review of Systems diagnosed with cataracts in both eyes dry eyes and presbyopia by ophthalmologist in March 2022     Objective:   Physical Exam Neck is supple.  Chest clear.  Cardiac exam :regular rate and rhythm. No murmur. Brief neurological exam is intact without focal deficits.  Vital signs reviewed. Affect though and judgement appear normal.       Assessment & Plan:  Extensive work-up recently has not revealed any evidence of demyelinating disease.  He did have COVID-19 in  January.  I am not sure that he has post COVID syndrome.  May be more related to anxiety.  We will try Lorazepam 1 mg at bedtime and see if symptoms improve.  There is follow-up with Dr. Felecia Shelling on June 28.

## 2021-04-22 NOTE — Patient Instructions (Signed)
Patient and I have discussed evaluation for long Covid. Will contact clinic at [facility] about an appointment.

## 2021-04-22 NOTE — Progress Notes (Addendum)
Subjective:    Patient ID: Eduardo Mata, male    DOB: Nov 12, 1970, 50 y.o.   MRN: [id number]  HPI  50 year old Male who works for [employer] in [employer] presents to office for the first time in April.  He stated he was taking Nexium over-the-counter for GE reflux.  History of Barrett's esophagus and reflux followed by Dr. Michail Sermon at [facility].  Had been treated with Merideth Abbey and Dexilant in the past.  History of constipation.  Had seen Dr. Fanny Skates in the past for internal hemorrhoids which were injected with a sclerosing solution in 2013.  He had COVID-19 in  January 2022.  Tested positive at work January 7th per document provided by EMS. He went to the emergency department January 19.  He told ED physician that he had myalgias, headache, sore throat and lost of taste and smell and nonbloody diarrhea with nausea.  Apparently had some diarrhea and upper respiratory symptoms.  He received received some IV fluids.  MI was ruled out.  EKG was within normal limits.  Chest x-ray was negative.   Subsequently had a telemedicine visit with [facility]  provider on October 30, 2020 complaining of fatigue, anxious, waking up at night with some vague odd feelings but no shortness of breath or palpitations.  Was told that some individuals would have symptoms beyond 2 weeks of having COVID.  Patient has not been vaccinated for COVID-19.    Recent labs show total cholesterol was 237, HDL 37, triglycerides 183 and LDL 166.    Previously had been treated with Tricor for hypertriglyceridemia.  This was discontinued  in April as he now had mixed hyperlipidemia and he  is now treated with Crestor.  He had appt with Dr Raul Del in [facility]  on April 13 regarding right inguinal discomfort and bulge.  History of open left inguinal hernia repair with mesh 1998.  Apparently had seen Dr. Serita Butcher in 2004 and ultrasound just showed some scar tissue.   Addendum: regarding hernias:  No obvious  hernia was found  on Dr. Brigitte Pulse evaluation April 13., 2022. Was sent for additional studies. On April 21, he had ultrasound of inguinal area showing right epididymis measuring 1 cm.  Had left varicocele.  No evidence for testicular mass or torsion.  Had small left hydrocele.  Had tiny cyst up to 3 mm in the left epididymis.  Also had CT of pelvis with contrast same day showing a tiny fat-containing right inguinal hernia.  Additional PMH: He initially saw Dr. Percival Spanish in 2013 for evaluation of his heart.  According to Dr. Rosezella Florida notes from 2013, he was diagnosed with rheumatic heart disease at age 52 and was treated with antibiotics.   Apparently was discharged from the Ward with this history.  EKG showed unusual P wave axis, possible ectopic atrial rhythm with minimal voltage criteria for LVH and nonspecific T wave changes in 2013.  Had 2D echocardiogram in 2013 which was normal including mitral valve.  Trivial regurgitation noted.  No stenosis.  Tricuspid valve was normal with trivial regurgitation.  Aorta was normal and not dilated.  No defect noted in atrial septum.  Left atrium was normal.  Left ventricle was normal. Dr. Michelle Piper did not think he had sequela from Rheumatic Fever at that time.   When he saw Dr. Percival Spanish on March 11, 2021, he was complaining of gastric upset, pain between his shoulder blades and sporadic numbness in his feet and arms.  These  symptoms would seemingly wake him up.  If he got up and moved around, they would go away.  He noticed a mild tremor and was referred to Neurologist.  He walks a great deal and does not have shortness of breath.  Denied palpitations syncope or presyncope.  History of rheumatic fever when he was 50 years old and had no sequela.  EKG was normal.  No murmur was appreciated.  Was not thought to have rheumatic heart disease.    A coronary calcium score was ordered for early July recommended by Dr. Percival Spanish who also agreed with statin therapy.  Does not  smoke or use smokeless tobacco.  Reports current alcohol use that is social.  Does not use drugs.  No known drug allergies.  Remote history of bilateral inguinal hernia repair  He has had recent extensive imaging studies including CT of the head without contrast, CT of the C-spine, MRI of the C-spine, MRI of the brain with and without contrast, MRI of the T-spine.  Patient presented to the Emergency department June 1 and  also June 6 complaining of paresthesias.  Complaining that there was numbness and tingling hands and feet for 3 days.  Felt like these issues were worse from mid thighs all the way down with some burning sensation in his legs and some numbness in his feet.  Improved with walking.  Noticed some numbness in right arm distally.  Complained of tremor in left fourth and fifth digit.  Complained of some neck pain.  B12 and folate levels were normal.  CBC and c-Met were normal.  CT of the C-spine and head were unremarkable.  He went back to the Emergency department on June 6 the same day that he saw Dr. Percival Spanish complaining of paresthesias.  Had MRI of the brain and C-spine as well as T-spine.  Had few scattered subcentimeter foci of T2/FLAIR hyperintensity involving the deep and subcortical white matter on the left greater than the right frontal lobes, nonspecific but overall mild for age.  Considerations were sequelae of chronic small vessel ischemia, migrainous disorder or less likely demyelination.   He saw Dr. Felecia Shelling, Neurologist regarding numbness  on June 7.  Was not thought to have polyneuropathy.  There was some numbness and weakness in the left hand in the distribution of the ulnar nerve and had a Tinel's sign at the elbow consistent with ulnar neuropathy.  He had Tinel's sign without fixed numbness or weakness in the right hand.  He reviewed MRI from 2014 showing right S1 nerve root compression.  He did not feel patient had Parkinson's disease.  He recommended nerve  conduction/EMG.  MRI C-spine, brain and T-spine have not showed evidence of multiple sclerosis   Patient called here on June 13 complaining of anxiety with numbness in hands and feet.    He is having some issues with odd paresthesias when he is about to fall asleep.  This is been disconcerting for him and he would like to try something for sleep.  Review of Systems diagnosed with cataracts in both eyes dry eyes and presbyopia by ophthalmologist in March 2022     Objective:   Physical Exam Neck is supple.  Chest clear.  Cardiac exam regular rate and rhythm.  Brief neurological exam is intact without focal deficits.  Vital signs reviewed.       Assessment & Plan:  Extensive work-up recently has not revealed any evidence of demyelinating disease.  He did have COVID-19 in January.  I  am not sure that he has post COVID syndrome.  May be more related to anxiety.  We will try lorazepam 1 mg at bedtime and see if symptoms improve.  There is follow-up with Dr. Felecia Shelling on June 28.

## 2021-04-22 NOTE — Progress Notes (Signed)
Subjective:    Patient ID: Eduardo Mata, male    DOB: Nov 12, 1970, 50 y.o.   MRN: [id number]  HPI  50 year old Male who works for [employer] in [employer] presents to office for the first time in April.  He stated he was taking Nexium over-the-counter for GE reflux.  History of Barrett's esophagus and reflux followed by Dr. Michail Sermon at [facility].  Had been treated with Merideth Abbey and Dexilant in the past.  History of constipation.  Had seen Dr. Fanny Skates in the past for internal hemorrhoids which were injected with a sclerosing solution in 2013.  He had COVID-19 in  January 2022.  Tested positive at work January 7th per document provided by EMS. He went to the emergency department January 19.  He told ED physician that he had myalgias, headache, sore throat and lost of taste and smell and nonbloody diarrhea with nausea.  Apparently had some diarrhea and upper respiratory symptoms.  He received received some IV fluids.  MI was ruled out.  EKG was within normal limits.  Chest x-ray was negative.   Subsequently had a telemedicine visit with [facility]  provider on October 30, 2020 complaining of fatigue, anxious, waking up at night with some vague odd feelings but no shortness of breath or palpitations.  Was told that some individuals would have symptoms beyond 2 weeks of having COVID.  Patient has not been vaccinated for COVID-19.    Recent labs show total cholesterol was 237, HDL 37, triglycerides 183 and LDL 166.    Previously had been treated with Tricor for hypertriglyceridemia.  This was discontinued  in April as he now had mixed hyperlipidemia and he  is now treated with Crestor.  He had appt with Dr Raul Del in [facility]  on April 13 regarding right inguinal discomfort and bulge.  History of open left inguinal hernia repair with mesh 1998.  Apparently had seen Dr. Serita Butcher in 2004 and ultrasound just showed some scar tissue.   Addendum: regarding hernias:  No obvious  hernia was found  on Dr. Brigitte Pulse evaluation April 13., 2022. Was sent for additional studies. On April 21, he had ultrasound of inguinal area showing right epididymis measuring 1 cm.  Had left varicocele.  No evidence for testicular mass or torsion.  Had small left hydrocele.  Had tiny cyst up to 3 mm in the left epididymis.  Also had CT of pelvis with contrast same day showing a tiny fat-containing right inguinal hernia.  Additional PMH: He initially saw Dr. Percival Spanish in 2013 for evaluation of his heart.  According to Dr. Rosezella Florida notes from 2013, he was diagnosed with rheumatic heart disease at age 52 and was treated with antibiotics.   Apparently was discharged from the Ward with this history.  EKG showed unusual P wave axis, possible ectopic atrial rhythm with minimal voltage criteria for LVH and nonspecific T wave changes in 2013.  Had 2D echocardiogram in 2013 which was normal including mitral valve.  Trivial regurgitation noted.  No stenosis.  Tricuspid valve was normal with trivial regurgitation.  Aorta was normal and not dilated.  No defect noted in atrial septum.  Left atrium was normal.  Left ventricle was normal. Dr. Michelle Piper did not think he had sequela from Rheumatic Fever at that time.   When he saw Dr. Percival Spanish on March 11, 2021, he was complaining of gastric upset, pain between his shoulder blades and sporadic numbness in his feet and arms.  These  symptoms would seemingly wake him up.  If he got up and moved around, they would go away.  He noticed a mild tremor and was referred to Neurologist.  He walks a great deal and does not have shortness of breath.  Denied palpitations syncope or presyncope.  History of rheumatic fever when he was 50 years old and had no sequela.  EKG was normal.  No murmur was appreciated.  Was not thought to have rheumatic heart disease.    A coronary calcium score was ordered for early July recommended by Dr. Percival Spanish who also agreed with statin therapy.  Does not  smoke or use smokeless tobacco.  Reports current alcohol use that is social.  Does not use drugs.  No known drug allergies.  Remote history of bilateral inguinal hernia repair  He has had recent extensive imaging studies including CT of the head without contrast, CT of the C-spine, MRI of the C-spine, MRI of the brain with and without contrast, MRI of the T-spine.  Patient presented to the Emergency department June 1 and  also June 6 complaining of paresthesias.  Complaining that there was numbness and tingling hands and feet for 3 days.  Felt like these issues were worse from mid thighs all the way down with some burning sensation in his legs and some numbness in his feet.  Improved with walking.  Noticed some numbness in right arm distally.  Complained of tremor in left fourth and fifth digit.  Complained of some neck pain.  B12 and folate levels were normal.  CBC and c-Met were normal.  CT of the C-spine and head were unremarkable.  He went back to the Emergency department on June 6 the same day that he saw Dr. Percival Spanish complaining of paresthesias.  Had MRI of the brain and C-spine as well as T-spine.  Had few scattered subcentimeter foci of T2/FLAIR hyperintensity involving the deep and subcortical white matter on the left greater than the right frontal lobes, nonspecific but overall mild for age.  Considerations were sequelae of chronic small vessel ischemia, migrainous disorder or less likely demyelination.   He saw Dr. Felecia Shelling, Neurologist regarding numbness  on June 7.  Was not thought to have polyneuropathy.  There was some numbness and weakness in the left hand in the distribution of the ulnar nerve and had a Tinel's sign at the elbow consistent with ulnar neuropathy.  He had Tinel's sign without fixed numbness or weakness in the right hand.  He reviewed MRI from 2014 showing right S1 nerve root compression.  He did not feel patient had Parkinson's disease.  He recommended nerve  conduction/EMG.  MRI C-spine, brain and T-spine have not showed evidence of multiple sclerosis   Patient called here on June 13 complaining of anxiety with numbness in hands and feet.    He is having some issues with odd paresthesias when he is about to fall asleep.  This is been disconcerting for him and he would like to try something for sleep.  Review of Systems diagnosed with cataracts in both eyes dry eyes and presbyopia by ophthalmologist in March 2022     Objective:   Physical Exam Neck is supple.  Chest clear.  Cardiac exam regular rate and rhythm.  Brief neurological exam is intact without focal deficits.  Vital signs reviewed.       Assessment & Plan:  Extensive work-up recently has not revealed any evidence of demyelinating disease.  He did have COVID-19 in January.  I  am not sure that he has post COVID syndrome.  May be more related to anxiety.  We will try lorazepam 1 mg at bedtime and see if symptoms improve.  There is follow-up with Dr. Felecia Shelling on June 28.

## 2021-05-02 ENCOUNTER — Telehealth: Payer: Self-pay | Admitting: Internal Medicine

## 2021-05-02 ENCOUNTER — Encounter: Payer: Self-pay | Admitting: Internal Medicine

## 2021-05-02 NOTE — Telephone Encounter (Signed)
Eduardo Mata received phone call from Infectious Disease Staff after we requested consultation and sent T cell data to them. Dr. Orvan Falconer reviewed. Does not think this test is helpful for Covid. Does not think consult needed at this time.

## 2021-05-02 NOTE — Telephone Encounter (Signed)
Message left on voice mail.  We received word  from ID clinic here in [facility] that Dr. Orvan Falconer, Infectious Disease physician, reviewed T cell data.He  did not feel this testing patient sent [place] with T cell values was of value regarding long Covid.  Burna Mortimer tells me [facility] does not have phone number we can call for follow up on referral. They state they will contact patient if referral is appropriate.   Perhaps patient can check with [facility] or [facility] to see if these health systems have Covid Clinics.  I don't think we can assist further at this time.

## 2021-05-06 ENCOUNTER — Telehealth: Payer: Self-pay | Admitting: Neurology

## 2021-05-06 NOTE — Telephone Encounter (Signed)
Pt returned call.  Patient states that since his last visit he has been experiencing worsening of his symptoms.  Patient states the numbness throughout has worsened and the tingling is now more of a burning sensation.  He states his upper thighs feel weak as if he has been working out, like muscle aches and shakiness.  He states that his difficulty standing and walking for long periods of time.  He is seeing an orthopedic [facility] and they have ordered a lumbar MRI to be completed.  They had also offered 1 week of prednisone and he states during that week he felt better but once the dose was completed the symptoms returned and seem to be worse. He states this is very scary and thinks something else could be going on. He c/o MRI brain,cervical, and thoracic spine MRI's In June 2022 as well as NCV/EMG study 04/02/21. Pt is taking the gabapentin as prescribed. He is hoping to get in sooner.   Advised the pt would inform Dr Epimenio Foot of the worsening symptoms to see if patient can be worked in sooner or if he recommends patient doing something else.

## 2021-05-06 NOTE — Telephone Encounter (Signed)
A apt opened up for Tuesday morning at 9 am. Called the patient and offered that time. Pt verbalized understanding and was appreciative for the call back

## 2021-05-06 NOTE — Telephone Encounter (Signed)
Called the patient back. There was no answer. Left a message asking for a call back.

## 2021-05-06 NOTE — Telephone Encounter (Signed)
Pt is asking for a call from RN to discuss numbness in legs, difficulty standing too long.  Pt is asking for a call to discuss weakness in legs and arms

## 2021-05-07 ENCOUNTER — Ambulatory Visit: Payer: 59 | Admitting: Neurology

## 2021-05-07 ENCOUNTER — Encounter: Payer: Self-pay | Admitting: Neurology

## 2021-05-07 VITALS — BP 108/75 | HR 65 | Ht 68.0 in | Wt 163.5 lb

## 2021-05-07 DIAGNOSIS — R208 Other disturbances of skin sensation: Secondary | ICD-10-CM | POA: Diagnosis not present

## 2021-05-07 DIAGNOSIS — R200 Anesthesia of skin: Secondary | ICD-10-CM | POA: Diagnosis not present

## 2021-05-07 DIAGNOSIS — R768 Other specified abnormal immunological findings in serum: Secondary | ICD-10-CM

## 2021-05-07 DIAGNOSIS — M791 Myalgia, unspecified site: Secondary | ICD-10-CM

## 2021-05-07 DIAGNOSIS — M5412 Radiculopathy, cervical region: Secondary | ICD-10-CM | POA: Insufficient documentation

## 2021-05-07 DIAGNOSIS — M4802 Spinal stenosis, cervical region: Secondary | ICD-10-CM

## 2021-05-07 NOTE — Progress Notes (Signed)
[facility]  PATIENT: Eduardo Mata DOB: 04-11-1971  REFERRING DOCTOR OR PCP: Marlan Palau, MD SOURCE: Patient, notes from the emergency room, laboratory and imaging reports, MRI and CT images personally reviewed  _________________________________   HISTORICAL  CHIEF COMPLAINT:  Chief Complaint  Patient presents with   Follow-up    Rm 1, alone. Here for worsening sx, burning in different areas in the body, arm weakness in legs and arms. Tingling/electrical shock in the midback, that wakes him up at night. Tips of finger are numb.     HISTORY OF PRESENT ILLNESS:  Eduardo Mata, is a 50 y.o. man with numbness and tingling.  Update 05/07/2021 He has been experiencing numbness and tingling on both hands   He also has tremors., eft > right.   He gets a zinging sensation in the mid upper back   He feels his muscles in his legs are weaker.   He has some myalgias lie he over exercised.    When he makes a fist the right upper arm is sore   Due to more symptoms and saw [facility].   He was prescribed a steroid pack and MRI lumbar was just done (results not in).      He is on gabapentin 300 mg at night+  EMG 04/02/2021 showed mild left C7 radiculopathy.   He had some polyphasic units in soe S1 innervated muscles  He had a CD4 count of 397 (normal was 500).      He was concerned about MS and ALS.  I reassured him no evidence of MS on MRI and no evidence of ALS on EMG.  I showed him the MRI images.  History of numbness:  He had Covid19 January 2022  and felt fine 2 weeks later but then a few days later started having pins and needles in his abdomen.  Labs were fine.   He is has had episodic tingling in legs > arms sine May 2022.  He went to the emergency room twice due to the symptoms.  He notes tingling in the left arm from the elbow to the 4th and 5th fingers.    Onset of tingling is sometimes associated with activity.    He notes the sensation in his feet  most frequently.   He does not note much pain, none in the arms (just tingling) but occasionally has mild burning in his legs.   He is not noting weakness.  The symptoms have not affected his gait.  No bladder dysfunction.  Due to the symptoms, he went to the emergency room first on 03/06/2021.  CT scan of the head with CT angiogram was essentially normal except for some calcification in the right cerebellar hemisphere (cannot explain symptoms).  He presented again to the [facility] emergency room last night and was transferred to [facility] to have urgent MRI of the brain, cervical spine and thoracic spine.  The spinal cord was normal.  He had a few T2 hyperintense foci in the brain that were nonspecific.  He did have some spinal stenosis, that was moderate at C6-C7.  He also has = lower and mid back pain.    In 2014, he had sciatica and had a lumbar MRI showing disc protrusions   Pain shot into one of his legs to the bottom of his foot.Symptoms improved with PT.    He has high triglycerides (on Crestor) but no HTN, DM or h/o smoking.  He is going to have a calcium score CT soon.   He tries to eat well and walks [id number] steps most days.      IMAGING (2022 images and select 2014 images personally reviewed): CT scan of the head 03/06/2021 shows calcifications in the right cerebellar hemisphere.   No acute findings  MRI of the head 03/12/2021 shows several scattered T2/FLAIR hyperintense foci, predominantly in the subcortical white matter.  None of these appear to be acute.  They do not enhance.  There is an expanded Virchow-Robin space versus chronic focus of ischemia or inflammation in the left cerebellar hemisphere near the fourth ventricle.  It does not enhance.  No pathology associated with the dystrophic calcification noted on the CT scan in the right cerebellar hemisphere.  MRI of the cervical and thoracic spine 03/12/2021 shows the spinal cord appears normal.  There are degenerative changes from C3-C4  through C6-C7.  This leads to borderline spinal stenosis at C3-C4, C4-C5 and C5-C6 and moderate spinal stenosis at C6-C7.  There is moderate foraminal narrowing to the left at C3-C4, mild bilateral at C5-C6 and moderate left and mild right at C6-C7.  However, there does not appear to be any nerve root compression.  MRI 2014 (showed a few images he took on his phone at Ortho office) shows a large right paramedian right L5S1 protrusion that could cause right S1 nerve root compression.     NCV/EMG 04/02/2021 showed  1.  Mild left chronic C7 radiculopathy without active features.   2.  No evidence of mononeuropathy in the left arm or leg or polyneuropathy.  REVIEW OF SYSTEMS: Constitutional: No fevers, chills, sweats, or change in appetite Eyes: No visual changes, double vision, eye pain Ear, nose and throat: No hearing loss, ear pain, nasal congestion, sore throat Cardiovascular: No chest pain, palpitations Respiratory:  No shortness of breath at rest or with exertion.   No wheezes GastrointestinaI: No nausea, vomiting, diarrhea, abdominal pain, fecal incontinence Genitourinary:  No dysuria, urinary retention or frequency.  No nocturia. Musculoskeletal:  No neck pain, back pain Integumentary: No rash, pruritus, skin lesions Neurological: as above Psychiatric: No depression at this time.  No anxiety Endocrine: No palpitations, diaphoresis, change in appetite, change in weigh or increased thirst Hematologic/Lymphatic:  No anemia, purpura, petechiae. Allergic/Immunologic: No itchy/runny eyes, nasal congestion, recent allergic reactions, rashes  ALLERGIES: No Known Allergies  HOME MEDICATIONS:  Current Outpatient Medications:    Ascorbic Acid (VITAMIN C) 500 MG CAPS, See admin instructions., Disp: , Rfl:    Cholecalciferol (VITAMIN D3 MAXIMUM STRENGTH) 125 MCG (5000 UT) capsule, See admin instructions., Disp: , Rfl:    esomeprazole (NEXIUM) 20 MG capsule, 1 capsule, Disp: , Rfl:     gabapentin (NEURONTIN) 300 MG capsule, Take 1 capsule (300 mg total) by mouth 3 (three) times daily., Disp: 90 capsule, Rfl: 5   LORazepam (ATIVAN) 1 MG tablet, Take 1 tablet (1 mg total) by mouth at bedtime., Disp: 30 tablet, Rfl: 1   Multiple Vitamins-Minerals (MULTIVITAMIN ADULTS 50+ PO), 1 tablet, Disp: , Rfl:    omega-3 acid ethyl esters (LOVAZA) 1 g capsule, Take by mouth 2 (two) times daily., Disp: , Rfl:   PAST MEDICAL HISTORY: Past Medical History:  Diagnosis Date   Dyslipidemia    GERD (gastroesophageal reflux disease)    Rheumatic fever     PAST SURGICAL HISTORY: Past Surgical History:  Procedure Laterality Date   INGUINAL HERNIA REPAIR     bilateral    FAMILY HISTORY:  Family History  Problem Relation Age of Onset   Barrett's esophagus Father    Parkinson's disease Maternal Grandfather     SOCIAL HISTORY:  Social History   Socioeconomic History   Marital status: Married    Spouse name: Not on file   Number of children: 2   Years of education: Not on file   Highest education level: Not on file  Occupational History   Occupation: IT     Employer: [employer]  Tobacco Use   Smoking status: Never   Smokeless tobacco: Never  Substance and Sexual Activity   Alcohol use: Yes    Comment: occasionally    Drug use: No   Sexual activity: Not on file  Other Topics Concern   Not on file  Social History Narrative   Lives w/ wife   Caffeine use: coffee daily (1 cup), soda sometimes   Married.     Right handed   Social Determinants of Health   Financial Resource Strain: Not on file  Food Insecurity: Not on file  Transportation Needs: Not on file  Physical Activity: Not on file  Stress: Not on file  Social Connections: Not on file  Intimate Partner Violence: Not on file     PHYSICAL EXAM  Vitals:   05/07/21 0903  BP: 108/75  Pulse: 65  Weight: 163 lb 8 oz (74.2 kg)  Height: 5\' 8"  (1.727 m)    Body mass index is 24.86 kg/m.   General: The  patient is well-developed and well-nourished and in no acute distress  HEENT:  Head is [place]/AT.  Sclera are anicteric.   Neck:  .  The neck is nontender.   Skin: Extremities are without rash or edema.   Neurologic Exam  Mental status: The patient is alert and oriented x 3 at the time of the examination. The patient has apparent normal recent and remote memory, with an apparently normal attention span and concentration ability.   Speech is normal.  Cranial nerves: Extraocular movements are full. Facial strength was symmetric.  No obvious hearing deficits are noted.  Motor:  Muscle bulk is normal.   Tone is normal. Strength is  4+/5 in ulnar innervated left hand muscles.   5/5 elsewhere in arms and in legs/feet  Sensory: Symmetric temperature and vibration today.   Coordination: Cerebellar testing reveals good finger-nose-finger and heel-to-shin bilaterally.  Gait and station: Station is normal.   Gait is normal. Tandem gait is normal. Romberg is negative.   Reflexes: Deep tendon reflexes are symmetric and normal bilaterally.        DIAGNOSTIC DATA (LABS, IMAGING, TESTING) - I reviewed patient records, labs, notes, testing and imaging myself where available.  Lab Results  Component Value Date   WBC 6.6 03/11/2021   HGB 15.0 03/11/2021   HCT 43.7 03/11/2021   MCV 91.2 03/11/2021   PLT 202 03/11/2021      Component Value Date/Time   NA 139 03/11/2021 2016   K 3.5 03/11/2021 2016   CL 104 03/11/2021 2016   CO2 28 03/11/2021 2016   GLUCOSE 88 03/11/2021 2016   BUN 13 03/11/2021 2016   CREATININE 0.81 03/11/2021 2016   CREATININE 0.98 01/11/2021 0930   CALCIUM 9.0 03/11/2021 2016   PROT 7.3 04/16/2021 0956   ALBUMIN 4.6 03/11/2021 2016   AST 22 04/16/2021 0956   ALT 29 04/16/2021 0956   ALKPHOS 78 03/11/2021 2016   BILITOT 0.7 04/16/2021 0956   GFRNONAA >60 03/11/2021 2016   GFRNONAA  89 01/11/2021 0930   GFRAA 103 01/11/2021 0930   Lab Results  Component Value Date    CHOL 168 04/16/2021   HDL 48 04/16/2021   LDLCALC 93 04/16/2021   TRIG 177 (H) 04/16/2021   CHOLHDL 3.5 04/16/2021   No results found for: HGBA1C Lab Results  Component Value Date   VITAMINB12 611 03/06/2021   Lab Results  Component Value Date   TSH 1.859 03/06/2021       ASSESSMENT AND PLAN  Myalgia - Plan: CK, HIV Antibody (routine testing w rflx)  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  C7 radic only partially explains arms and spinal stenosis probably not bad enough to affect legs.  Try to increase gabapentin to 300 mg po tid. As CD4 count was low we wil check HIV He has myalgias ans is on a statin.  I will check CK.  He did not have evidence of myopathy on EMG recently. Stay active and exercise as tolerated Rtc 4 months, sooner if new or worsening symptoms  Shaketa Serafin A. Epimenio Foot, MD, [facility] 05/07/2021, 10:01 AM Certified in Neurology, Clinical Neurophysiology, Sleep Medicine and Neuroimaging  [facility] [address], [address] [place], [place] [address] [phone]:

## 2021-05-08 LAB — HIV ANTIBODY (ROUTINE TESTING W REFLEX): HIV Screen 4th Generation wRfx: NONREACTIVE

## 2021-05-08 LAB — CK: Total CK: 82 U/L (ref 41–331)

## 2021-05-13 ENCOUNTER — Telehealth: Payer: Self-pay | Admitting: Internal Medicine

## 2021-05-13 NOTE — Telephone Encounter (Signed)
Eduardo Mata [phone]  Barak called to say he has a white coating on his tongue, he had gone to urgent care and they gave him nystatin and fluconzolcse, but it has not helped. Would like to come in for you to look at it.

## 2021-05-13 NOTE — Telephone Encounter (Signed)
Called to let patient know what Dr Baxley said, he verbalized understanding. °

## 2021-06-12 ENCOUNTER — Other Ambulatory Visit: Payer: Self-pay | Admitting: Physician Assistant

## 2021-06-12 DIAGNOSIS — R109 Unspecified abdominal pain: Secondary | ICD-10-CM

## 2021-07-01 ENCOUNTER — Other Ambulatory Visit: Payer: 59

## 2021-07-02 ENCOUNTER — Ambulatory Visit: Payer: 59 | Admitting: Neurology

## 2021-07-03 ENCOUNTER — Telehealth: Payer: Self-pay | Admitting: Neurology

## 2021-07-03 NOTE — Telephone Encounter (Signed)
You can offer him tomorrow at 1030am with Dr. Epimenio Foot. I placed it on hold for you. Thank you!

## 2021-07-03 NOTE — Telephone Encounter (Signed)
Called pt. He accepted appt tomorrow at 1030am w/ Dr. Epimenio Foot. Advised him to check in at 10am, bring insurance cards and updated med list.

## 2021-07-03 NOTE — Telephone Encounter (Signed)
Pt called says his weakness is becoming worse, and wanting to be seen earlier if that is okay with the doctor. Didn't see anything sooner, any suggestions? I will call the pt back to schedule.

## 2021-07-04 ENCOUNTER — Encounter: Payer: Self-pay | Admitting: Neurology

## 2021-07-04 ENCOUNTER — Ambulatory Visit: Payer: 59 | Admitting: Neurology

## 2021-07-04 VITALS — BP 118/76 | HR 71 | Ht 68.0 in | Wt 160.5 lb

## 2021-07-04 DIAGNOSIS — M5412 Radiculopathy, cervical region: Secondary | ICD-10-CM

## 2021-07-04 DIAGNOSIS — R200 Anesthesia of skin: Secondary | ICD-10-CM | POA: Diagnosis not present

## 2021-07-04 DIAGNOSIS — R208 Other disturbances of skin sensation: Secondary | ICD-10-CM

## 2021-07-04 DIAGNOSIS — M791 Myalgia, unspecified site: Secondary | ICD-10-CM

## 2021-07-04 NOTE — Progress Notes (Signed)
[facility]  PATIENT: Eduardo Mata DOB: 03-Jun-1971  REFERRING DOCTOR OR PCP: Marlan Palau, MD SOURCE: Patient, notes from the emergency room, laboratory and imaging reports, MRI and CT images personally reviewed  _________________________________   HISTORICAL  CHIEF COMPLAINT:  Chief Complaint  Patient presents with   Follow-up    Rm 1, alone. Pt is here today for increased weakness, numbness in hands and feet. Burning in thighs. Shaking, unable to walk and stand for long periods. Thirsty. Frequent urination. Feeling overall bad and sick.     HISTORY OF PRESENT ILLNESS:  Eduardo Mata, is a 50 y.o. man with numbness and tingling.  Update 07/04/2021 He is reporting some additional symptoms and changes in some of the symptoms he had at the last visit.   The pins and needles sensations in the hands have improved but he still reports numbness in his hands and feet.  Additionally, he has woken up with numbness in the thighs that last several minutes multiple nights.  He has myalgias and feels weaker if he stands a long time.    Myalgias have been present for multiple months.  When I checked him 05/07/2021 he had reported additional symptoms including worsening myalgia.  Additionally, a CD4 count had returned borderline low.  So we checked CK (normal) and HIV (nonreactive).   They were fine.  He is concerned about DM as sugars have been high and he had oral thrush.   HgBA1c was 5.4 though glucose was 134.   He reports thirst and ED.   He is on gabapentin 300 mg at night.  EMG 04/02/2021 showed mild left C7 radiculopathy.   He had some polyphasic units in a couple S1 innervated muscles but no definite radiculopathy.  He had a CD4 count of 397 (normal was 500).    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  Otherwise see where an Epstein-Barr panel was taken, I do not have access to those results.  He was initially concerned about MS and ALS.  I  reassured him no evidence of MS on MRI and no evidence of ALS on EMG.  I have shown him the MRI images.  He denies a problem with depression or anxiety.  His family has been concerned about his symptoms and they took him to [facility] ED.    He has seen a therapist who advised him to make sure there is no underlying issue.      History of numbness:  He had Covid19 January 2022  and felt fine 2 weeks later but then a few days later started having pins and needles in his abdomen.  Labs were fine.   He is has had episodic tingling in legs > arms sine May 2022.  He went to the emergency room twice due to the symptoms.  He notes tingling in the left arm from the elbow to the 4th and 5th fingers.    Onset of tingling is sometimes associated with activity.    He notes the sensation in his feet most frequently.   He does not note much pain, none in the arms (just tingling) but occasionally has mild burning in his legs.   He is not noting weakness.  The symptoms have not affected his gait.  No bladder dysfunction.  Due to the symptoms, he went to the emergency room first on 03/06/2021.  CT scan of the head with CT angiogram was essentially normal except for some calcification in the  right cerebellar hemisphere (cannot explain symptoms).  He presented again to the [facility] emergency room last night and was transferred to [facility] to have urgent MRI of the brain, cervical spine and thoracic spine.  The spinal cord was normal.  He had a few T2 hyperintense foci in the brain that were nonspecific.  He did have some spinal stenosis, that was moderate at C6-C7.  He also has = lower and mid back pain.    In 2014, he had sciatica and had a lumbar MRI showing disc protrusions   Pain shot into one of his legs to the bottom of his foot.Symptoms improved with PT.    He has high triglycerides (on Crestor) but no HTN, DM or h/o smoking.    He is going to have a calcium score CT soon.   He tries to eat well and walks 53976 steps  most days.      IMAGING (2022 images and select 2014 images personally reviewed): CT scan of the head 03/06/2021 shows calcifications in the right cerebellar hemisphere.   No acute findings  MRI of the head 03/12/2021 shows several scattered T2/FLAIR hyperintense foci, predominantly in the subcortical white matter.  None of these appear to be acute.  They do not enhance.  There is an expanded Virchow-Robin space versus chronic focus of ischemia or inflammation in the left cerebellar hemisphere near the fourth ventricle.  It does not enhance.  No pathology associated with the dystrophic calcification noted on the CT scan in the right cerebellar hemisphere.  MRI of the cervical and thoracic spine 03/12/2021 shows the spinal cord appears normal.  There are degenerative changes from C3-C4 through C6-C7.  This leads to borderline spinal stenosis at C3-C4, C4-C5 and C5-C6 and moderate spinal stenosis at C6-C7.  There is moderate foraminal narrowing to the left at C3-C4, mild bilateral at C5-C6 and moderate left and mild right at C6-C7.  However, there does not appear to be any nerve root compression.  MRI 2014 (showed a few images he took on his phone at Ortho office) shows a large right paramedian right L5S1 protrusion that could cause right S1 nerve root compression.     NCV/EMG 04/02/2021 showed  1.  Mild left chronic C7 radiculopathy without active features.   2.  No evidence of mononeuropathy in the left arm or leg or polyneuropathy.  REVIEW OF SYSTEMS: Constitutional: No fevers, chills, sweats, or change in appetite Eyes: No visual changes, double vision, eye pain Ear, nose and throat: No hearing loss, ear pain, nasal congestion, sore throat Cardiovascular: No chest pain, palpitations Respiratory:  No shortness of breath at rest or with exertion.   No wheezes GastrointestinaI: No nausea, vomiting, diarrhea, abdominal pain, fecal incontinence Genitourinary:  No dysuria, urinary retention or frequency.   No nocturia. Musculoskeletal:  No neck pain, back pain Integumentary: No rash, pruritus, skin lesions Neurological: as above Psychiatric: No depression at this time.  No anxiety Endocrine: No palpitations, diaphoresis, change in appetite, change in weigh or increased thirst Hematologic/Lymphatic:  No anemia, purpura, petechiae. Allergic/Immunologic: No itchy/runny eyes, nasal congestion, recent allergic reactions, rashes  ALLERGIES: No Known Allergies  HOME MEDICATIONS:  Current Outpatient Medications:    Ascorbic Acid (VITAMIN C) 500 MG CAPS, See admin instructions., Disp: , Rfl:    Cholecalciferol (VITAMIN D3 MAXIMUM STRENGTH) 125 MCG (5000 UT) capsule, See admin instructions., Disp: , Rfl:    esomeprazole (NEXIUM) 20 MG capsule, 1 capsule, Disp: , Rfl:    gabapentin (NEURONTIN) 300  MG capsule, Take 1 capsule (300 mg total) by mouth 3 (three) times daily., Disp: 90 capsule, Rfl: 5   LORazepam (ATIVAN) 1 MG tablet, Take 1 tablet (1 mg total) by mouth at bedtime. (Patient taking differently: Take 1 mg by mouth at bedtime as needed.), Disp: 30 tablet, Rfl: 1   Multiple Vitamins-Minerals (MULTIVITAMIN ADULTS 50+ PO), 1 tablet, Disp: , Rfl:    omega-3 acid ethyl esters (LOVAZA) 1 g capsule, Take by mouth 2 (two) times daily., Disp: , Rfl:   PAST MEDICAL HISTORY: Past Medical History:  Diagnosis Date   Dyslipidemia    GERD (gastroesophageal reflux disease)    Rheumatic fever     PAST SURGICAL HISTORY: Past Surgical History:  Procedure Laterality Date   INGUINAL HERNIA REPAIR     bilateral    FAMILY HISTORY: Family History  Problem Relation Age of Onset   Barrett's esophagus Father    Parkinson's disease Maternal Grandfather     SOCIAL HISTORY:  Social History   Socioeconomic History   Marital status: Married    Spouse name: Not on file   Number of children: 2   Years of education: Not on file   Highest education level: Not on file  Occupational History    Occupation: IT     Employer: [employer]  Tobacco Use   Smoking status: Never   Smokeless tobacco: Never  Substance and Sexual Activity   Alcohol use: Yes    Comment: occasionally    Drug use: No   Sexual activity: Not on file  Other Topics Concern   Not on file  Social History Narrative   Lives w/ wife   Caffeine use: coffee daily (1 cup), soda sometimes   Married.     Right handed   Social Determinants of Health   Financial Resource Strain: Not on file  Food Insecurity: Not on file  Transportation Needs: Not on file  Physical Activity: Not on file  Stress: Not on file  Social Connections: Not on file  Intimate Partner Violence: Not on file     PHYSICAL EXAM  Vitals:   07/04/21 1014  BP: 118/76  Pulse: 71  Weight: 160 lb 8 oz (72.8 kg)  Height: 5\' 8"  (1.727 m)    Body mass index is 24.4 kg/m.   General: The patient is well-developed and well-nourished and in no acute distress  HEENT:  Head is [place]/AT.  Sclera are anicteric.   Neck:  .  The neck is nontender.   Skin: Extremities are without rash or edema.   Neurologic Exam  Mental status: The patient is alert and oriented x 3 at the time of the examination. The patient has apparent normal recent and remote memory, with an apparently normal attention span and concentration ability.   Speech is normal.  Cranial nerves: Extraocular movements are full. Facial strength was symmetric.  No obvious hearing deficits are noted.  Motor:  Muscle bulk is normal.   Tone is normal. Strength is  4+/5 in ulnar innervated left hand muscles.   5/5 elsewhere in arms and in legs/feet  Sensory: Symmetric temperature and vibration today.   Coordination: Cerebellar testing reveals good finger-nose-finger and heel-to-shin bilaterally.  Gait and station: Station is normal.   Gait is normal.  Tandem gait is normal.  Romberg is negative..   Reflexes: Deep tendon reflexes are symmetric and 3 at knees and 2 elsewhere  bilaterally.    Np ankle clonus    DIAGNOSTIC DATA (LABS, IMAGING, TESTING) -  I reviewed patient records, labs, notes, testing and imaging myself where available.  Lab Results  Component Value Date   WBC 6.6 03/11/2021   HGB 15.0 03/11/2021   HCT 43.7 03/11/2021   MCV 91.2 03/11/2021   PLT 202 03/11/2021      Component Value Date/Time   NA 139 03/11/2021 2016   K 3.5 03/11/2021 2016   CL 104 03/11/2021 2016   CO2 28 03/11/2021 2016   GLUCOSE 88 03/11/2021 2016   BUN 13 03/11/2021 2016   CREATININE 0.81 03/11/2021 2016   CREATININE 0.98 01/11/2021 0930   CALCIUM 9.0 03/11/2021 2016   PROT 7.3 04/16/2021 0956   ALBUMIN 4.6 03/11/2021 2016   AST 22 04/16/2021 0956   ALT 29 04/16/2021 0956   ALKPHOS 78 03/11/2021 2016   BILITOT 0.7 04/16/2021 0956   GFRNONAA >60 03/11/2021 2016   GFRNONAA 89 01/11/2021 0930   GFRAA 103 01/11/2021 0930   Lab Results  Component Value Date   CHOL 168 04/16/2021   HDL 48 04/16/2021   LDLCALC 93 04/16/2021   TRIG 177 (H) 04/16/2021   CHOLHDL 3.5 04/16/2021   No results found for: HGBA1C Lab Results  Component Value Date   VITAMINB12 611 03/06/2021   Lab Results  Component Value Date   TSH 1.859 03/06/2021       ASSESSMENT AND PLAN  Myalgia  Dysesthesia  Numbness  C7 radiculopathy   I had a long discussion with him that I have not been able to find any neurologic abnormality that could explain his symptoms.  Specifically, MRI of the brain and spine do not show any central nervous system process.  NCV/EMG did not show evidence of neuropathy or myopathy.  New or fluctuating sensory symptoms are unlikely to be significant.  I am concerned that his symptoms are related to anxiety.  He denied that he has had much problems with anxiety.  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  That diagnosis might explain symptoms like myalgias and fatigue but not sensory changes.  He generally began to experience symptoms  after he was diagnosed with COVID-19 earlier this year and it is possible he has long COVID. Stay active and exercise as tolerated Rtc as needed for significant new or worsening neurologic symptoms.  As we have not been able to identify a neurologic issue, if symptoms persist, he may benefit from a second opinion with another neurologist  Chavonne Sforza A. Epimenio Foot, MD, [facility] 07/04/2021, 12:08 PM Certified in Neurology, Clinical Neurophysiology, Sleep Medicine and Neuroimaging  [facility] [address] [place], [place] [address] [phone]:

## 2021-07-19 ENCOUNTER — Encounter (HOSPITAL_COMMUNITY): Payer: Self-pay | Admitting: Emergency Medicine

## 2021-07-19 ENCOUNTER — Emergency Department (HOSPITAL_COMMUNITY)
Admission: EM | Admit: 2021-07-19 | Discharge: 2021-07-19 | Disposition: A | Discharge status: Home / Self Care | Payer: 59 | Attending: Emergency Medicine | Admitting: Emergency Medicine

## 2021-07-19 DIAGNOSIS — Z8616 Personal history of COVID-19: Secondary | ICD-10-CM | POA: Diagnosis not present

## 2021-07-19 DIAGNOSIS — R208 Other disturbances of skin sensation: Secondary | ICD-10-CM | POA: Diagnosis not present

## 2021-07-19 DIAGNOSIS — R209 Unspecified disturbances of skin sensation: Secondary | ICD-10-CM | POA: Diagnosis not present

## 2021-07-19 DIAGNOSIS — R531 Weakness: Secondary | ICD-10-CM | POA: Diagnosis not present

## 2021-07-19 DIAGNOSIS — R197 Diarrhea, unspecified: Secondary | ICD-10-CM | POA: Diagnosis present

## 2021-07-19 LAB — COMPREHENSIVE METABOLIC PANEL
ALT: 22 U/L (ref 0–44)
AST: 20 U/L (ref 15–41)
Albumin: 4.3 g/dL (ref 3.5–5.0)
Alkaline Phosphatase: 78 U/L (ref 38–126)
Anion gap: 8 (ref 5–15)
BUN: 8 mg/dL (ref 6–20)
CO2: 27 mmol/L (ref 22–32)
Calcium: 9.5 mg/dL (ref 8.9–10.3)
Chloride: 102 mmol/L (ref 98–111)
Creatinine, Ser: 0.92 mg/dL (ref 0.61–1.24)
GFR, Estimated: >60 mL/min (ref >60)
Glucose, Bld: 102 mg/dL — ABNORMAL HIGH (ref 70–99)
Potassium: 4 mmol/L (ref 3.5–5.1)
Sodium: 137 mmol/L (ref 135–145)
Total Bilirubin: 0.9 mg/dL (ref 0.3–1.2)
Total Protein: 6.9 g/dL (ref 6.5–8.1)

## 2021-07-19 LAB — CBC
HCT: 45 % (ref 39.0–52.0)
Hemoglobin: 14.8 g/dL (ref 13.0–17.0)
MCH: 30.8 pg (ref 26.0–34.0)
MCHC: 32.9 g/dL (ref 30.0–36.0)
MCV: 93.6 fL (ref 80.0–100.0)
Platelets: 217 10*3/uL (ref 150–400)
RBC: 4.81 MIL/uL (ref 4.22–5.81)
RDW: 12.2 % (ref 11.5–15.5)
WBC: 4.2 10*3/uL (ref 4.0–10.5)
nRBC: 0 % (ref 0.0–0.2)

## 2021-07-19 LAB — URINALYSIS, ROUTINE W REFLEX MICROSCOPIC
Bilirubin Urine: NEGATIVE
Glucose, UA: NEGATIVE mg/dL
Hgb urine dipstick: NEGATIVE
Ketones, ur: NEGATIVE mg/dL
Leukocytes,Ua: NEGATIVE
Nitrite: NEGATIVE
Protein, ur: NEGATIVE mg/dL
Specific Gravity, Urine: 1.002 — ABNORMAL LOW (ref 1.005–1.030)
pH: 7 (ref 5.0–8.0)

## 2021-07-19 LAB — MAGNESIUM: Magnesium: 2.2 mg/dL (ref 1.7–2.4)

## 2021-07-19 LAB — TSH: TSH: 1.171 u[IU]/mL (ref 0.350–4.500)

## 2021-07-19 LAB — PHOSPHORUS: Phosphorus: 3 mg/dL (ref 2.5–4.6)

## 2021-07-19 NOTE — ED Triage Notes (Signed)
Pt endorses bilateral hand and leg numbness since January. Endorses frequent urination, fatigue, diarrhea. Pt reports he has seen infectious disease and has future appt to see rheumatologist.

## 2021-07-19 NOTE — Discharge Instructions (Addendum)
Your evaluation has been reassuring today.  You have stool studies pending; results can be followed online in MyChart.  A daily probiotic may help to alleviate your intermittent diarrhea.  This can be purchased over-the-counter at your local pharmacy.  Continue follow-up with your specialists including rheumatology.  In the interim, you may also see your primary care doctor.  Return for new or concerning symptoms.

## 2021-07-19 NOTE — ED Notes (Signed)
Stool sample sent to the lab - stool was very formed

## 2021-07-19 NOTE — ED Provider Notes (Signed)
Emergency Medicine Provider Triage Evaluation Note  Eduardo Mata , a 50 y.o. male  was evaluated in triage.  Pt complains of not feeling well.  Review of Systems  Positive: Fatigue, diarrhea, tingling sensation and cold sensation to extremities Negative: Fever, cough, sob, n/v  Physical Exam  BP (!) 131/95 (BP Location: Right Arm)   Pulse 91   Temp 98.9 F (37.2 C) (Oral)   Resp 14   Ht 5\' 8"  (1.727 m)   Wt 72.6 kg   SpO2 100%   BMI 24.33 kg/m  Gen:   Awake, no distress   Resp:  Normal effort  MSK:   Moves extremities without difficulty  Other:    Medical Decision Making  Medically screening exam initiated at 1:24 PM.  Appropriate orders placed.  NAOL ONTIVEROS was informed that the remainder of the evaluation will be completed by another provider, this initial triage assessment does not replace that evaluation, and the importance of remaining in the ED until their evaluation is complete.  Had covid in Jan and never felt normal since.  Has been evaluated multiple times by several specialists without definitive diagnosed.  Unsure if it's post covid sxs but report feeling fatigue, cold hands/feet, diarrhea, and just not his normal self.    Feb, PA-C 07/19/21 1326    07/21/21, MD 07/23/21 [phone]

## 2021-07-19 NOTE — ED Provider Notes (Signed)
[facility] EMERGENCY DEPARTMENT Provider Note   CSN: [id number] Arrival date & time: 07/19/21  1306     History Chief Complaint  Patient presents with   Numbness   Diarrhea    Eduardo Mata is a 50 y.o. male.  50 year old male with a history of dyslipidemia, esophageal reflux presents to the emergency department for evaluation of ongoing paresthesias.  He reports feeling intermittent tingling sensations in his hands and feet.  Sometimes the tingling will feel more like a burning sensation at times.  His symptoms have been persistent since diagnosis of COVID in January.  He denies any known modifying factors of his symptoms.  Has not taken any medications for these complaints.  Does report subjective weakness in his bilateral upper and lower extremities, though he denies any issues with ambulation or lifting.  Has noted some sporadic diarrhea; no more than 2-3 stools per day when present.  No recent abx use or associated abdominal pain, melena, hematochezia.  Further denies associated fevers, cough, shortness of breath, nausea, vomiting.  The patient has been seen by a number of specialists for these complaints including neurology and infectious disease.  He was recently referred to rheumatology, but does not have an appointment for a few months.  He expresses concern that his symptoms are worsening despite not having a clear answer for his complaints.  The history is provided by the patient. No language interpreter was used.  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   C7 radiculopathy 05/07/2021   Dysesthesia 05/07/2021   Numbness 04/02/2021   Cervical stenosis of spinal canal 04/02/2021   Murmur 03/10/2021   Fatigue 11/05/2011    Rheumatic heart disease    Hypertriglyceridemia     Past Surgical History:  Procedure Laterality Date   INGUINAL HERNIA REPAIR     bilateral       Family History  Problem Relation Age of Onset   Barrett's esophagus Father    Parkinson's disease Maternal Grandfather     Social History   Tobacco Use   Smoking status: Never   Smokeless tobacco: Never  Substance Use Topics   Alcohol use: Yes    Comment: occasionally    Drug use: No    Home Medications Prior to Admission medications   Medication Sig Start Date End Date Taking? Authorizing Provider  Ascorbic Acid (VITAMIN C) 500 MG CAPS See admin instructions.    [provider]  Cholecalciferol (VITAMIN D3 MAXIMUM STRENGTH) 125 MCG (5000 UT) capsule See admin instructions.    [provider]  esomeprazole (NEXIUM) 20 MG capsule 1 capsule    [provider]  gabapentin (NEURONTIN) 300 MG capsule Take 1 capsule (300 mg total) by mouth 3 (three) times daily. 03/25/21   Sater, Pearletha Furl, MD  LORazepam (ATIVAN) 1 MG tablet Take 1 tablet (1 mg total) by mouth at bedtime. Patient taking differently: Take 1 mg by mouth at bedtime as needed. 03/19/21   Margaree Mackintosh, MD  Multiple Vitamins-Minerals (MULTIVITAMIN ADULTS 50+ PO) 1 tablet    [provider]  omega-3 acid ethyl esters (LOVAZA) 1 g capsule Take by mouth 2 (two) times daily.    [provider]  Allergies    Patient has no known allergies.  Review of Systems   Review of Systems  Gastrointestinal:  Positive for diarrhea.  Ten systems reviewed and are negative for acute change, except as noted in the HPI.    Physical Exam Updated Vital Signs BP 101/79   Pulse 71   Temp 97.9 F (36.6 C)   Resp 18   Ht 5\' 8"  (1.727 m)   Wt 72.6 kg   SpO2 97%   BMI 24.33 kg/m   Physical Exam Vitals and nursing note reviewed.  Constitutional:      General: He is not in acute distress.    Appearance: He is well-developed. He is not  diaphoretic.     Comments: Nontoxic appearing and in NAD  HENT:     Head: Normocephalic and atraumatic.  Eyes:     General: No scleral icterus.    Conjunctiva/sclera: Conjunctivae normal.  Neck:     Comments: No meningismus Pulmonary:     Effort: Pulmonary effort is normal. No respiratory distress.     Comments: Respirations even and unlabored Musculoskeletal:        General: Normal range of motion.     Cervical back: Normal range of motion.  Skin:    General: Skin is warm and dry.     Coloration: Skin is not pale.     Findings: No erythema or rash.  Neurological:     Mental Status: He is alert and oriented to person, place, and time.     Coordination: Coordination normal.     Comments: GCS 15. Speech is goal oriented. No cranial nerve deficits appreciated; symmetric eyebrow raise, no facial drooping, tongue midline. Patient has equal grip strength bilaterally with 5/5 strength against resistance in all major muscle groups bilaterally. Sensation to light touch intact. Patient moves extremities purposefully, without ataxia.  No dysmetria to finger-nose-finger.  No pronator drift.    Psychiatric:        Mood and Affect: Mood is anxious.        Behavior: Behavior normal.    ED Results / Procedures / Treatments   Labs (all labs ordered are listed, but only abnormal results are displayed) Labs Reviewed  URINALYSIS, ROUTINE W REFLEX MICROSCOPIC - Abnormal; Notable for the following components:      Result Value   Color, Urine COLORLESS (*)    Specific Gravity, Urine 1.002 (*)    All other components within normal limits  COMPREHENSIVE METABOLIC PANEL - Abnormal; Notable for the following components:   Glucose, Bld 102 (*)    All other components within normal limits  GASTROINTESTINAL PANEL BY PCR, STOOL (REPLACES STOOL CULTURE)  CBC  TSH  MAGNESIUM  PHOSPHORUS    EKG EKG Interpretation  Date/Time:  Friday July 19 2021 13:14:57 EDT Ventricular Rate:  90 PR  Interval:  120 QRS Duration: 82 QT Interval:  328 QTC Calculation: 401 R Axis:   48 Text Interpretation: Normal sinus rhythm Nonspecific T wave abnormality Abnormal ECG Confirmed by [id number] ([id number]) on 07/19/2021 8:01:08 PM  Radiology No results found.  Procedures Procedures   Medications Ordered in ED Medications - No data to display  ED Course  I have reviewed the triage vital signs and the nursing notes.  Pertinent labs & imaging results that were available during my care of the patient were reviewed by me and considered in my medical decision making (see chart for details).    MDM Rules/Calculators/A&P  50 year old male presents to the emergency department for symptoms consistent with ongoing dysesthesia since COVID diagnosis 2 months ago.  Also complains of associated weakness in his extremities, though he has no objective signs of weakness on exam today.  His neurologic exam is nonfocal.  Chart has been reviewed extensively.  He did undergo MRI with and without contrast of the brain, cervical, thoracic spine back in June for further evaluation of these complaints.  Findings were nonspecific, but generally reassuring.  He did not have evidence of myopathy on EMG recently.  Just seen by ID at [facility] 3 days ago.  Also referred to Rheumatology, but pending this f/u appointment.   His evaluation in the ED appears at baseline and is reassuring. Given symptom chronicity, hemodynamic stability, normal labs, normal neurologic exam - low suspicion for emergent condition warranting inpatient evaluation, though have encouraged continued f/u with his designated specialists.  GI pathogen panel was ordered given diarrheal complaints, but can be followed as an outpatient as well.  Return precautions discussed and provided. Patient discharged in stable condition with no unaddressed concerns.   Final Clinical Impression(s) / ED Diagnoses Final diagnoses:  Dysesthesia   Diarrhea, unspecified type    Rx / DC Orders ED Discharge Orders     None        Antony Madura, PA-C 07/19/21 2323    Gloris Manchester, MD 07/21/21 1229

## 2021-07-20 LAB — GASTROINTESTINAL PANEL BY PCR, STOOL (REPLACES STOOL CULTURE)

## 2021-08-03 ENCOUNTER — Encounter (HOSPITAL_COMMUNITY): Payer: Self-pay | Admitting: Emergency Medicine

## 2021-08-03 ENCOUNTER — Other Ambulatory Visit: Payer: Self-pay

## 2021-08-03 ENCOUNTER — Emergency Department (HOSPITAL_COMMUNITY)
Admission: EM | Admit: 2021-08-03 | Discharge: 2021-08-04 | Disposition: A | Discharge status: Home / Self Care | Payer: 59 | Attending: Emergency Medicine | Admitting: Emergency Medicine

## 2021-08-03 ENCOUNTER — Emergency Department (HOSPITAL_COMMUNITY): Payer: 59

## 2021-08-03 DIAGNOSIS — R202 Paresthesia of skin: Secondary | ICD-10-CM | POA: Insufficient documentation

## 2021-08-03 DIAGNOSIS — R531 Weakness: Secondary | ICD-10-CM

## 2021-08-03 DIAGNOSIS — Z20822 Contact with and (suspected) exposure to covid-19: Secondary | ICD-10-CM | POA: Insufficient documentation

## 2021-08-03 DIAGNOSIS — G2571 Drug induced akathisia: Secondary | ICD-10-CM | POA: Diagnosis not present

## 2021-08-03 DIAGNOSIS — R519 Headache, unspecified: Secondary | ICD-10-CM | POA: Diagnosis not present

## 2021-08-03 LAB — URINALYSIS, COMPLETE (UACMP) WITH MICROSCOPIC
Bacteria, UA: NONE SEEN
Bilirubin Urine: NEGATIVE
Glucose, UA: NEGATIVE mg/dL
Hgb urine dipstick: NEGATIVE
Ketones, ur: NEGATIVE mg/dL
Leukocytes,Ua: NEGATIVE
Nitrite: NEGATIVE
Protein, ur: NEGATIVE mg/dL
Specific Gravity, Urine: 1.004 — ABNORMAL LOW (ref 1.005–1.030)
pH: 5 (ref 5.0–8.0)

## 2021-08-03 LAB — CBC WITH DIFFERENTIAL/PLATELET
Abs Immature Granulocytes: 0.03 10*3/uL (ref 0.00–0.07)
Basophils Absolute: 0 10*3/uL (ref 0.0–0.1)
Basophils Relative: 0 %
Eosinophils Absolute: 0 10*3/uL (ref 0.0–0.5)
Eosinophils Relative: 0 %
HCT: 42 % (ref 39.0–52.0)
Hemoglobin: 14.5 g/dL (ref 13.0–17.0)
Immature Granulocytes: 0 %
Lymphocytes Relative: 21 %
Lymphs Abs: 1.7 10*3/uL (ref 0.7–4.0)
MCH: 31.5 pg (ref 26.0–34.0)
MCHC: 34.5 g/dL (ref 30.0–36.0)
MCV: 91.1 fL (ref 80.0–100.0)
Monocytes Absolute: 0.6 10*3/uL (ref 0.1–1.0)
Monocytes Relative: 7 %
Neutro Abs: 5.7 10*3/uL (ref 1.7–7.7)
Neutrophils Relative %: 72 %
Platelets: 191 10*3/uL (ref 150–400)
RBC: 4.61 MIL/uL (ref 4.22–5.81)
RDW: 12.1 % (ref 11.5–15.5)
WBC: 8.1 10*3/uL (ref 4.0–10.5)
nRBC: 0 % (ref 0.0–0.2)

## 2021-08-03 LAB — TROPONIN I (HIGH SENSITIVITY)
Troponin I (High Sensitivity): 4 ng/L (ref <18)
Troponin I (High Sensitivity): 3 ng/L (ref <18)

## 2021-08-03 LAB — RESP PANEL BY RT-PCR (FLU A&B, COVID) ARPGX2
Influenza A by PCR: NEGATIVE
Influenza B by PCR: NEGATIVE
SARS Coronavirus 2 by RT PCR: NEGATIVE

## 2021-08-03 LAB — COMPREHENSIVE METABOLIC PANEL
ALT: 23 U/L (ref 0–44)
AST: 19 U/L (ref 15–41)
Albumin: 4 g/dL (ref 3.5–5.0)
Alkaline Phosphatase: 70 U/L (ref 38–126)
Anion gap: 10 (ref 5–15)
BUN: 7 mg/dL (ref 6–20)
CO2: 24 mmol/L (ref 22–32)
Calcium: 9 mg/dL (ref 8.9–10.3)
Chloride: 99 mmol/L (ref 98–111)
Creatinine, Ser: 0.92 mg/dL (ref 0.61–1.24)
GFR, Estimated: >60 mL/min (ref >60)
Glucose, Bld: 92 mg/dL (ref 70–99)
Potassium: 3.5 mmol/L (ref 3.5–5.1)
Sodium: 133 mmol/L — ABNORMAL LOW (ref 135–145)
Total Bilirubin: 1.1 mg/dL (ref 0.3–1.2)
Total Protein: 6.6 g/dL (ref 6.5–8.1)

## 2021-08-03 LAB — CK: Total CK: 73 U/L (ref 49–397)

## 2021-08-03 MED ORDER — SODIUM CHLORIDE 0.9% FLUSH: 3.0000 mL | Freq: Once | INTRAVENOUS | Status: DC

## 2021-08-03 NOTE — ED Provider Notes (Signed)
Emergency Medicine Provider Triage Evaluation Note  Eduardo Mata , a 50 y.o. male  was evaluated in triage.  Pt complains of headache, and back pain.  He states that the headache started this morning at about 8am.  He denies any specific activity at the time it started.  He has other symptoms such as back pain, weakness, difficulty walking but that has been present for multiple weeks.   Review of Systems  Positive: Headache, decreased hearing, taste and smell.  Negative: fevers  Physical Exam  BP 116/83 (BP Location: Right Arm)   Pulse 74   Temp 98.3 F (36.8 C) (Oral)   Resp 16   Ht 5\' 8"  (1.727 m)   Wt 70.8 kg   SpO2 100%   BMI 23.72 kg/m  Gen:   Awake, no distress  Resp:  Normal effort  MSK:   Moves extremities without difficulty  Other:  Normal speech.    Medical Decision Making  Medically screening exam initiated at 6:19 PM.  Appropriate orders placed.  TACARI REPASS was informed that the remainder of the evaluation will be completed by another provider, this initial triage assessment does not replace that evaluation, and the importance of remaining in the ED until their evaluation is complete.     Richardson Dopp, PA-C 08/03/21 1826    [place], 08/05/21, MD 08/03/21 [phone]

## 2021-08-03 NOTE — ED Triage Notes (Signed)
Pt reports generalized headache since this morning with runny nose.  Also reports generalized weakness.  Reports intermittent hand tremors since April.  Negative home COVID test.

## 2021-08-04 MED ORDER — DIPHENHYDRAMINE HCL 50 MG/ML IJ SOLN
25.0000 mg | Freq: Once | INTRAMUSCULAR | Status: AC
  Administered 2021-08-04: 25 mg via INTRAVENOUS
  Filled 2021-08-04: qty 1

## 2021-08-04 MED ORDER — PROCHLORPERAZINE EDISYLATE 10 MG/2ML IJ SOLN
10.0000 mg | Freq: Once | INTRAMUSCULAR | Status: AC
  Administered 2021-08-04: 10 mg via INTRAVENOUS
  Filled 2021-08-04: qty 2

## 2021-08-04 MED ORDER — SODIUM CHLORIDE 0.9 % IV BOLUS
1000.0000 mL | Freq: Once | INTRAVENOUS | Status: AC
  Administered 2021-08-04: 1000 mL via INTRAVENOUS

## 2021-08-04 MED ORDER — DEXAMETHASONE SODIUM PHOSPHATE 10 MG/ML IJ SOLN
10.0000 mg | Freq: Once | INTRAMUSCULAR | Status: DC
  Filled 2021-08-04: qty 1

## 2021-08-04 MED ORDER — KETOROLAC TROMETHAMINE 30 MG/ML IJ SOLN
15.0000 mg | Freq: Once | INTRAMUSCULAR | Status: AC
  Administered 2021-08-04: 15 mg via INTRAVENOUS
  Filled 2021-08-04: qty 1

## 2021-08-04 NOTE — ED Provider Notes (Signed)
[facility] EMERGENCY DEPARTMENT Provider Note   CSN: [id number] Arrival date & time: 08/03/21  1627     History Chief Complaint  Patient presents with   Headache   Weakness    Eduardo Mata is a 50 y.o. male.  50 year old male whose had some issues for the last 8 to 9 months related to COVID possibly.  Sounds he is having myalgias and paresthesias he had multiple MRIs, CT scans, neurology consultations and outpatient work-up which is all been negative reported by the patient.  He has rheumatology appointment on Monday.  Today he does not expect any diagnosis for the other issues but really does want to be evaluated for his headache.   Headache Pain location:  Generalized Quality:  Dull Severity currently:  6/10 Onset quality:  Gradual Timing:  Constant Chronicity:  Recurrent Similar to prior headaches: yes (similar in feel but worse in severity and duration)   Context: not activity and not defecating   Relieved by:  None tried Worsened by:  Nothing Ineffective treatments:  None tried Associated symptoms: tingling (not new) and weakness   Associated symptoms: no cough, no dizziness and no fever   Weakness Severity:  Mild Onset quality:  Gradual Timing:  Constant Progression:  Worsening Chronicity:  Chronic Context: stress   Context: not alcohol use, not dehydration and not recent infection   Relieved by:  None tried Worsened by:  Nothing Associated symptoms: headaches   Associated symptoms: no cough, no dizziness and no fever       Past Medical History:  Diagnosis Date   Dyslipidemia    GERD (gastroesophageal reflux disease)    Rheumatic fever     Patient Active Problem List   Diagnosis Date Noted   Myalgia 05/07/2021   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   C7 radiculopathy 05/07/2021   Dysesthesia 05/07/2021   Numbness 04/02/2021   Cervical stenosis of spinal canal 04/02/2021   Murmur 03/10/2021   Fatigue  11/05/2011   Rheumatic heart disease    Hypertriglyceridemia     Past Surgical History:  Procedure Laterality Date   INGUINAL HERNIA REPAIR     bilateral       Family History  Problem Relation Age of Onset   Barrett's esophagus Father    Parkinson's disease Maternal Grandfather     Social History   Tobacco Use   Smoking status: Never   Smokeless tobacco: Never  Substance Use Topics   Alcohol use: Yes    Comment: occasionally    Drug use: No    Home Medications Prior to Admission medications   Medication Sig Start Date End Date Taking? Authorizing Provider  Ascorbic Acid (VITAMIN C) 500 MG CAPS See admin instructions.    [provider]  Cholecalciferol (VITAMIN D3 MAXIMUM STRENGTH) 125 MCG (5000 UT) capsule See admin instructions.    [provider]  esomeprazole (NEXIUM) 20 MG capsule 1 capsule    [provider]  gabapentin (NEURONTIN) 300 MG capsule Take 1 capsule (300 mg total) by mouth 3 (three) times daily. 03/25/21   Sater, Pearletha Furl, MD  LORazepam (ATIVAN) 1 MG tablet Take 1 tablet (1 mg total) by mouth at bedtime. Patient taking differently: Take 1 mg by mouth at bedtime as needed. 03/19/21   Margaree Mackintosh, MD  Multiple Vitamins-Minerals (MULTIVITAMIN ADULTS 50+ PO) 1 tablet    [provider]  omega-3 acid ethyl esters (LOVAZA) 1 g capsule Take by mouth 2 (  two) times daily.    [provider]    Allergies    Patient has no known allergies.  Review of Systems   Review of Systems  Constitutional:  Negative for fever.  Respiratory:  Negative for cough.   Neurological:  Positive for weakness and headaches. Negative for dizziness.  All other systems reviewed and are negative.  Physical Exam Updated Vital Signs BP 111/84 (BP Location: Left Arm)   Pulse 73   Temp 98 F (36.7 C) (Oral)   Resp 18   Ht 5\' 8"  (1.727 m)   Wt 70.8 kg   SpO2 98%   BMI 23.72 kg/m   Physical Exam Vitals and nursing note reviewed.   Constitutional:      Appearance: He is well-developed.  HENT:     Head: Normocephalic and atraumatic.  Eyes:     Extraocular Movements: Extraocular movements intact.  Cardiovascular:     Rate and Rhythm: Normal rate.  Pulmonary:     Effort: Pulmonary effort is normal. No respiratory distress.  Abdominal:     General: There is no distension.  Musculoskeletal:        General: Normal range of motion.     Cervical back: Normal range of motion.  Neurological:     Mental Status: He is alert and oriented to person, place, and time.     Cranial Nerves: No cranial nerve deficit.    ED Results / Procedures / Treatments   Labs (all labs ordered are listed, but only abnormal results are displayed) Labs Reviewed  COMPREHENSIVE METABOLIC PANEL - Abnormal; Notable for the following components:      Result Value   Sodium 133 (*)    All other components within normal limits  URINALYSIS, COMPLETE (UACMP) WITH MICROSCOPIC - Abnormal; Notable for the following components:   Color, Urine STRAW (*)    Specific Gravity, Urine 1.004 (*)    All other components within normal limits  RESP PANEL BY RT-PCR (FLU A&B, COVID) ARPGX2  CBC WITH DIFFERENTIAL/PLATELET  CK  TROPONIN I (HIGH SENSITIVITY)  TROPONIN I (HIGH SENSITIVITY)    EKG None  Radiology CT HEAD WO CONTRAST ( )  Result Date: 08/03/2021 CLINICAL DATA:  Severe headache. EXAM: CT HEAD WITHOUT CONTRAST TECHNIQUE: Contiguous axial images were obtained from the base of the skull through the vertex without intravenous contrast. COMPARISON:  March 06, 2021 FINDINGS: Brain: No evidence of acute infarction, hemorrhage, hydrocephalus, extra-axial collection or mass lesion/mass effect. Vascular: No hyperdense vessel or unexpected calcification. Skull: Normal. Negative for fracture or focal lesion. Sinuses/Orbits: No acute finding. Other: None. IMPRESSION: No acute intracranial pathology. Electronically Signed   By: March 08, 2021 M.D.   On:  08/03/2021 19:11    Procedures Procedures   Medications Ordered in ED Medications  sodium chloride flush (NS) 0.9 % injection 3 mL (3 mLs Intravenous Not Given 08/04/21 0025)  dexamethasone (DECADRON) injection 10 mg (0 mg Intravenous Hold 08/04/21 0114)  prochlorperazine (COMPAZINE) injection 10 mg (10 mg Intravenous Given 08/04/21 0101)  diphenhydrAMINE (BENADRYL) injection 25 mg (25 mg Intravenous Given 08/04/21 0100)  sodium chloride 0.9 % bolus 1,000 mL (0 mLs Intravenous Stopped 08/04/21 0324)  ketorolac (TORADOL) 30 MG/ML injection 15 mg (15 mg Intravenous Given 08/04/21 0101)    ED Course  I have reviewed the triage vital signs and the nursing notes.  Pertinent labs & imaging results that were available during my care of the patient were reviewed by me and considered in my medical decision making (  see chart for details).    MDM Rules/Calculators/A&P                         Workup unremarkable. Will assist with headache control.   Headache resolved. Mild associated akathisias. Will take benadryl at home. Stable for d/c w/ rheum fu Monday for chronic symptoms.   Final Clinical Impression(s) / ED Diagnoses Final diagnoses:  Generalized weakness  Generalized headache    Rx / DC Orders ED Discharge Orders     None        Karizma Cheek, Barbara Cower, MD 08/04/21 [phone]

## 2021-09-19 ENCOUNTER — Ambulatory Visit: Payer: 59 | Admitting: Neurology

## 2022-03-27 IMAGING — MR MR CERVICAL SPINE WO/W CM
6 of 8 series · 28 of 48 positions shown · IV contrast (gadavist)
Comparison: Prior CT from 03/06/2021.

CLINICAL DATA: Initial evaluation for acute numbness, tingling,
paresthesias in hands and feet.

EXAM:
MRI CERVICAL SPINE WITHOUT AND WITH CONTRAST
TECHNIQUE: Multiplanar and multiecho pulse sequences of the cervical spine, to
include the craniocervical junction and cervicothoracic junction,
were obtained without and with intravenous contrast.
CONTRAST:  7.5mL GADAVIST GADOBUTROL 1 MMOL/ML IV SOLN

[Series 5: T2 · sagittal · 3.0mm · 0.69mm/px · 3 of 15 slices shown (1 of 2)]
[im 1/15]
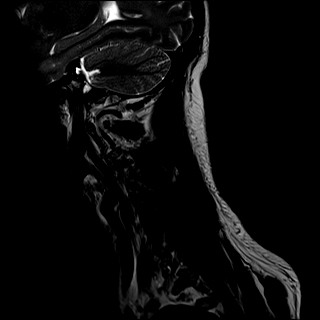
[im 8/15]
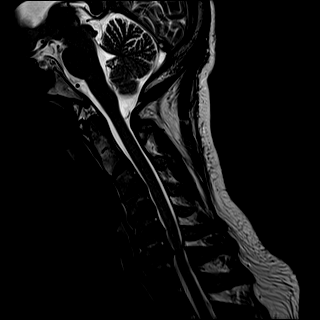
[im 15/15]
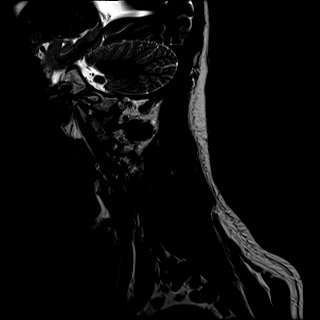

[Series 6: T1 · sagittal · 3.0mm · 0.69mm/px · 3 of 15 slices shown (1 of 2)]
[im 1/15]
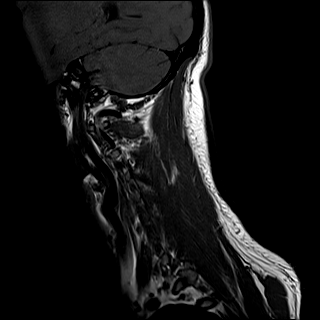
[im 8/15]
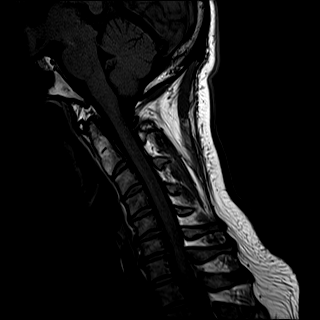
[im 15/15]
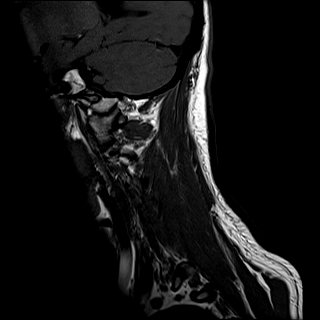

[Series 7: STIR · sagittal · 3.0mm · 0.86mm/px · 1 of 15 slices shown]
[im 1/15]
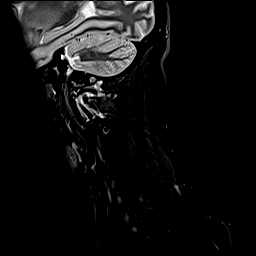

[Series 8: T2 · axial · 3.0mm · 0.66mm/px · z∈[-233,-114]mm · 9 of 40 slices shown (2 of 2)]
[im 1/40]
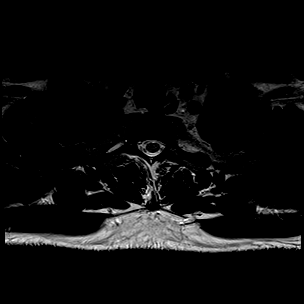
[im 5/40]
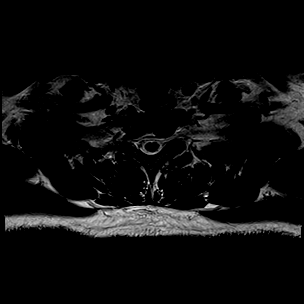
[im 10/40]
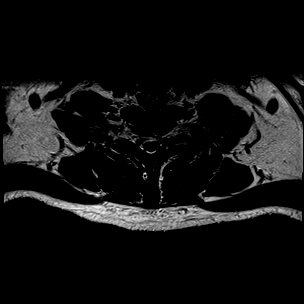
[im 15/40]
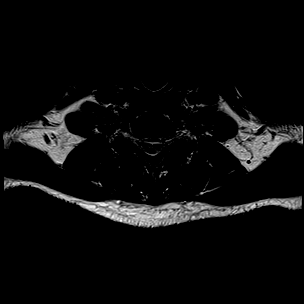
[im 20/40]
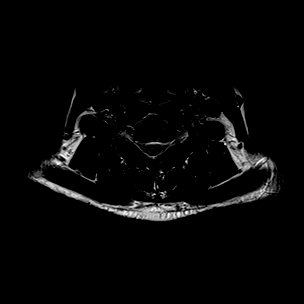
[im 25/40]
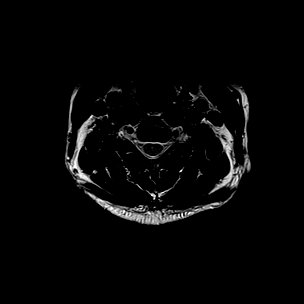
[im 30/40]
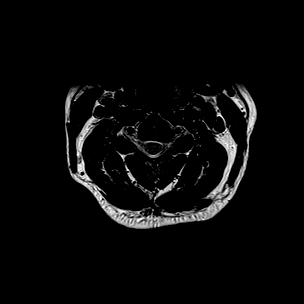
[im 35/40]
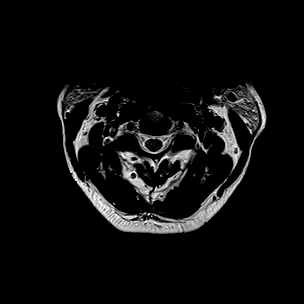
[im 40/40]
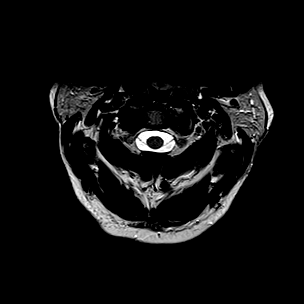

[Series 10: T1 · axial · 3.0mm · 0.39mm/px · z∈[-233,-114]mm · 9 of 40 slices shown (2 of 2)]
[im 1/40]
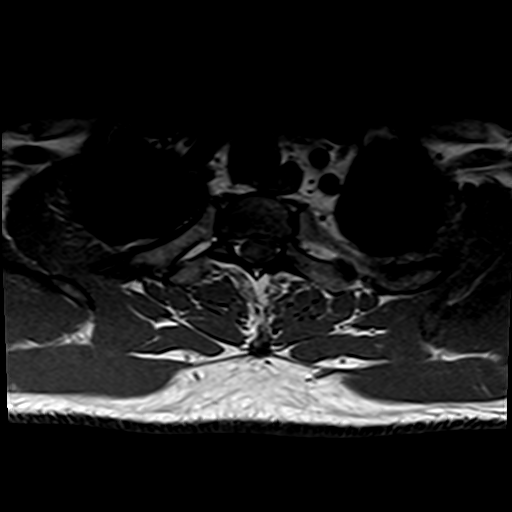
[im 5/40]
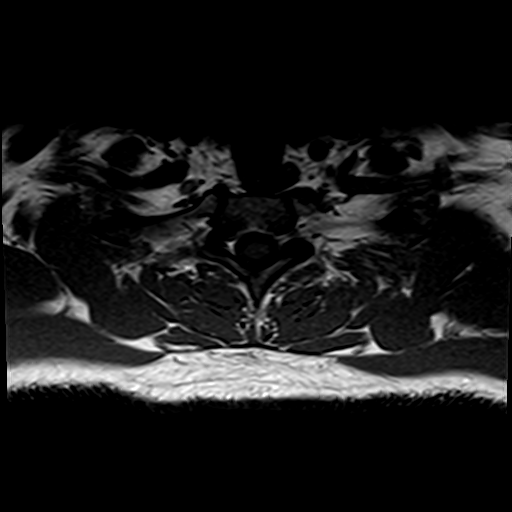
[im 10/40]
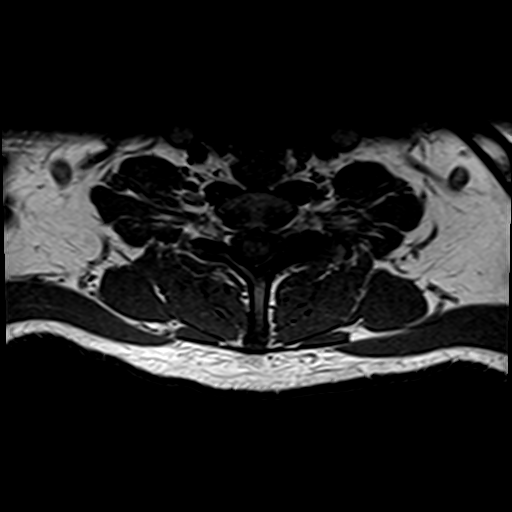
[im 15/40]
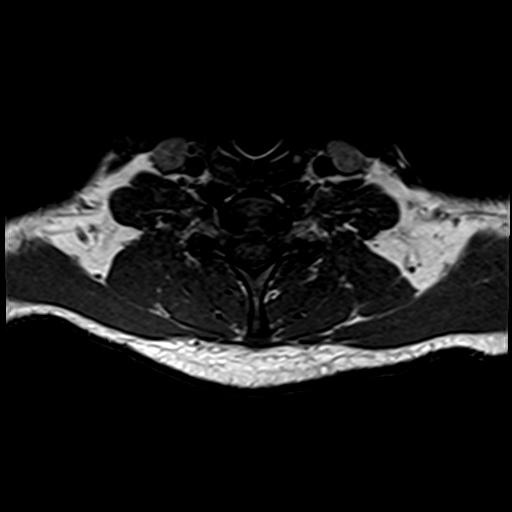
[im 20/40]
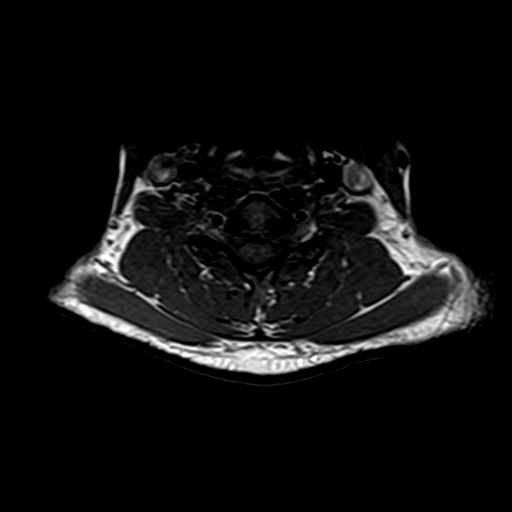
[im 25/40]
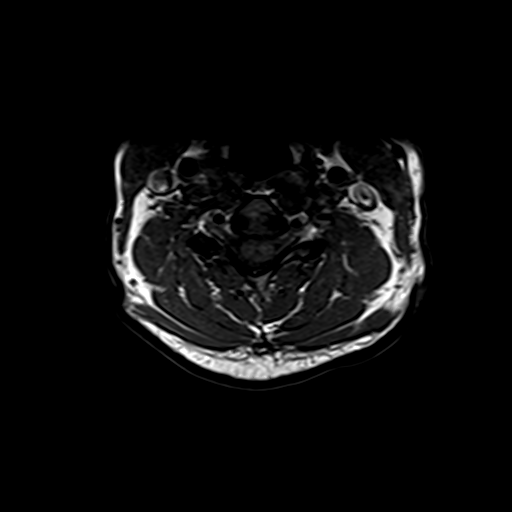
[im 30/40]
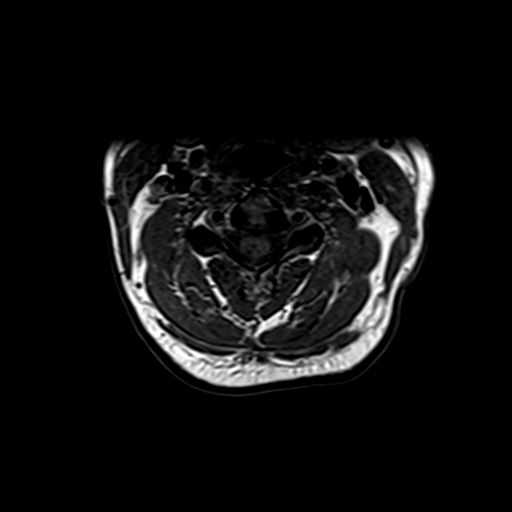
[im 35/40]
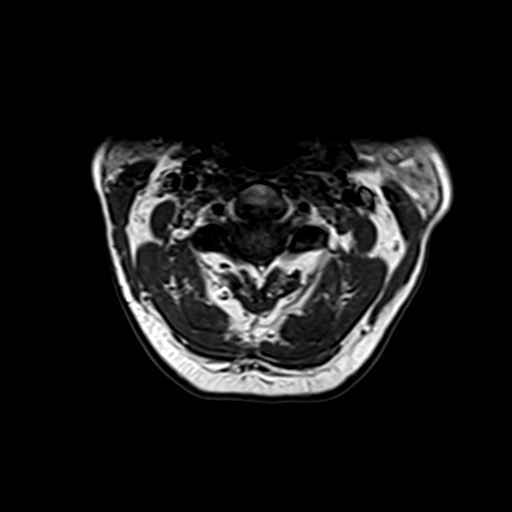
[im 40/40]
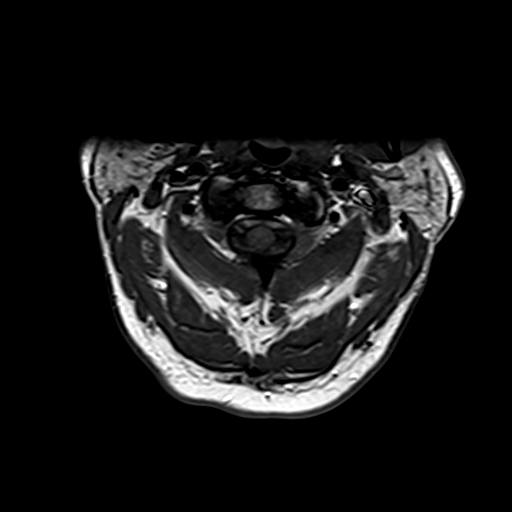

[Series 11: T1 fat-sat post-contrast · sagittal · 3.0mm · 0.43mm/px · 3 of 15 slices shown]
[im 1/15]
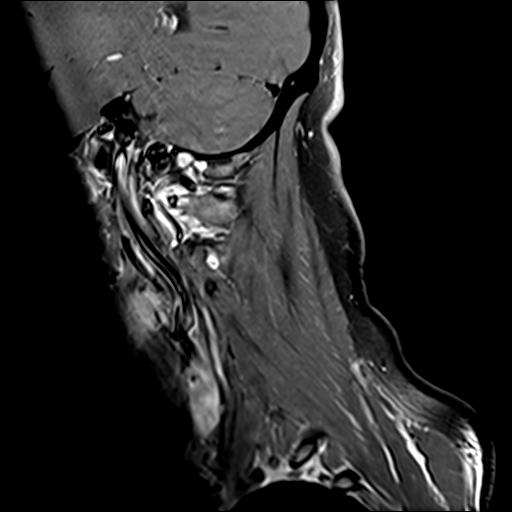
[im 8/15]
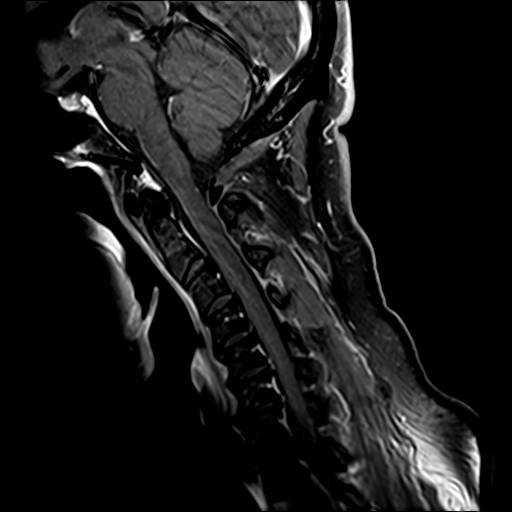
[im 15/15]
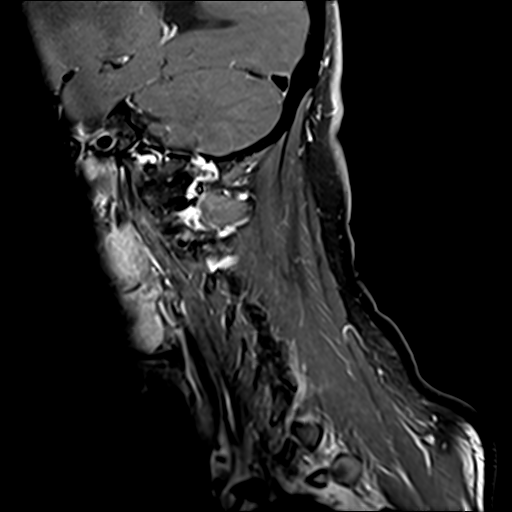

[28 of 48 positions shown; findings below may reference images not displayed]

FINDINGS: Alignment: Straightening with mild reversal of the normal cervical
lordosis. No listhesis.

Vertebrae: Vertebral body height maintained without acute or chronic
fracture. Bone marrow signal intensity within normal limits. No
discrete or worrisome osseous lesions. No abnormal marrow edema or
enhancement.

Cord: Normal signal and morphology.  No abnormal enhancement.

Posterior Fossa, vertebral arteries, paraspinal tissues:
Craniocervical junction within normal limits. Paraspinous and
prevertebral soft tissues normal. Normal flow voids seen within the
vertebral arteries bilaterally.

Disc levels:

C2-C3: Unremarkable.

C3-C4: Mild disc bulge with left greater than right uncovertebral
hypertrophy. Flattening and partial effacement of the ventral thecal
sac with resultant mild spinal stenosis. Moderate left C4 foraminal
stenosis. Right neural foramina remains patent.

C4-C5: Mild disc bulge with uncovertebral spurring. Flattening and
partial effacement of the ventral thecal sac with resultant mild
spinal stenosis. Foramina remain patent.

C5-C6: Mild disc bulge with uncovertebral hypertrophy. Broad
posterior disc osteophyte effaces the ventral thecal sac, contacting
and mildly flattening the ventral cord, greater on the right. Mild
spinal stenosis. Moderate right worse than left C6 foraminal
narrowing.

C6-C7: Degenerative intervertebral disc space narrowing with diffuse
disc osteophyte complex. Broad posterior component indents and
partially faces the ventral thecal sac, asymmetric to the left. Mild
cord flattening without cord signal changes. Moderate spinal
stenosis. Moderate left with mild right C7 foraminal narrowing.

C7-T1: Negative interspace. Left-sided facet degeneration with
associated joint effusion. No spinal stenosis. Foramina remain
patent.
IMPRESSION: 1. Normal MRI appearance of the cervical spinal cord. No evidence
for demyelinating disease or other abnormality.
2. Multilevel cervical spondylosis with resultant mild to moderate
diffuse spinal stenosis at C3-4 through C6-7, most pronounced at
C6-7. Associated mild to moderate bilateral C4 through C7 foraminal
stenosis as above.

## 2022-03-27 IMAGING — MR MR HEAD WO/W CM
14 of 16 series · 41 of 48 positions shown · IV contrast (gadavist)
Comparison: Prior head CT from 03/06/2021.

CLINICAL DATA: Initial evaluation for numbness, tingling,
paresthesias in bilateral hands and feet.

EXAM:
MRI HEAD WITHOUT AND WITH CONTRAST
TECHNIQUE: Multiplanar, multiecho pulse sequences of the brain and surrounding
structures were obtained without and with intravenous contrast.
CONTRAST:  7.5mL GADAVIST GADOBUTROL 1 MMOL/ML IV SOLN

[Series 1: DWI · axial · 3.0mm · 0.88mm/px · z∈[-98,+56]mm · 8 of 106 slices shown (1 of 4)]
[im 1/106]
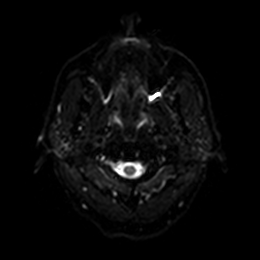
[im 16/106]
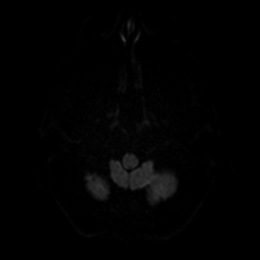
[im 31/106]
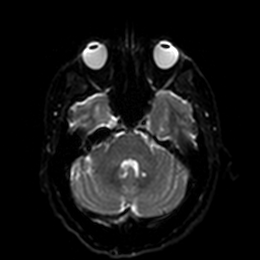
[im 46/106]
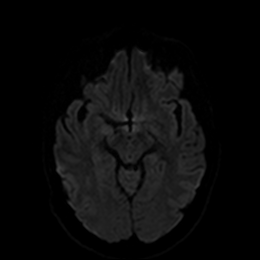
[im 61/106]
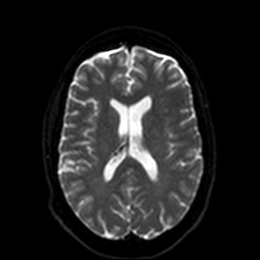
[im 76/106]
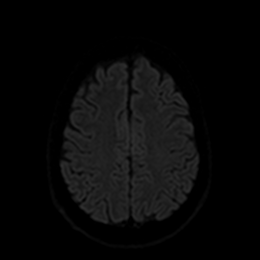
[im 91/106]
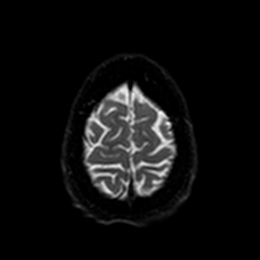
[im 106/106]
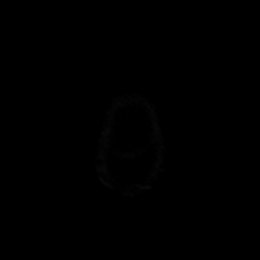

[Series 2: DWI · axial · 3.0mm · 0.88mm/px · z∈[-98,+56]mm · 3 of 52 slices shown (2 of 4)]
[im 1/52]
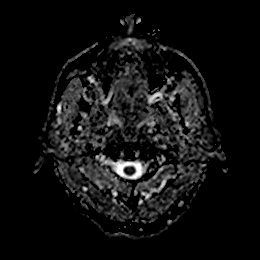
[im 26/52]
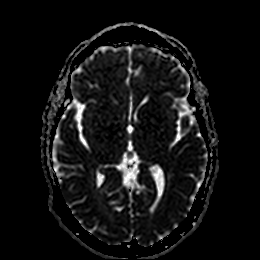
[im 52/52]
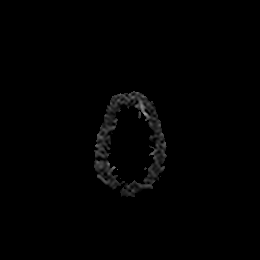

[Series 3: DWI · coronal · 4.0mm · 0.88mm/px · 5 of 74 slices shown (3 of 4)]
[im 1/74]
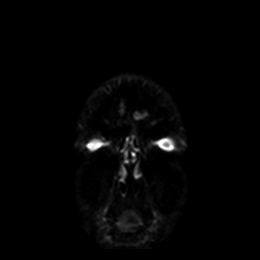
[im 19/74]
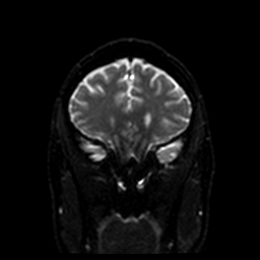
[im 37/74]
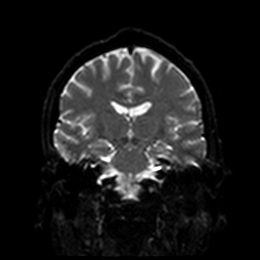
[im 55/74]
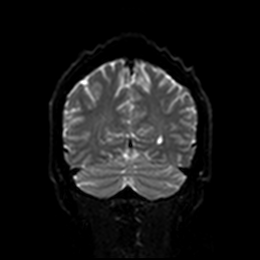
[im 74/74]
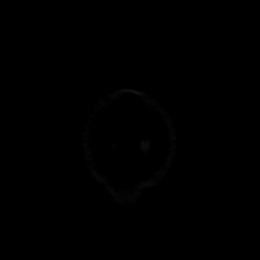

[Series 4: DWI · coronal · 4.0mm · 0.88mm/px · 2 of 37 slices shown (4 of 4)]
[im 1/37]
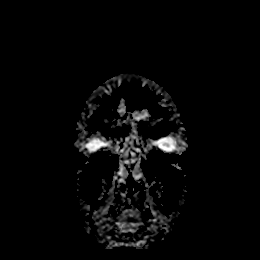
[im 37/37]
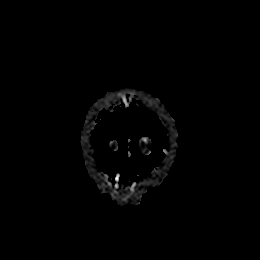

[Series 5: T1 · sagittal · 5.0mm · 0.75mm/px · 2 of 25 slices shown]
[im 1/25]
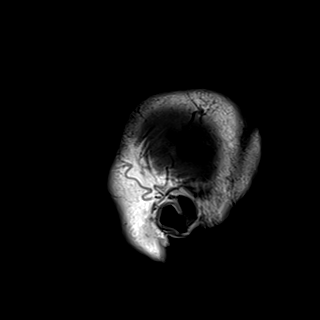
[im 25/25]
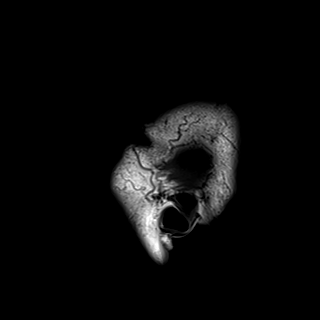

[Series 6: T2 · axial · 5.0mm · 0.72mm/px · z∈[-98,+56]mm · 2 of 27 slices shown]
[im 1/27]
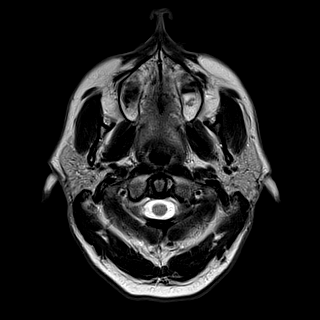
[im 27/27]
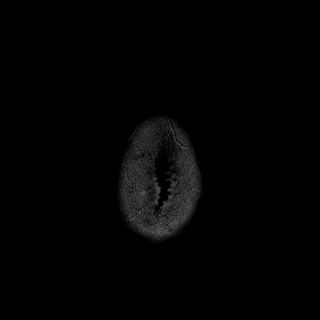

[Series 7: FLAIR · axial · 5.0mm · 0.45mm/px · z∈[-98,+56]mm · 2 of 27 slices shown]
[im 1/27]
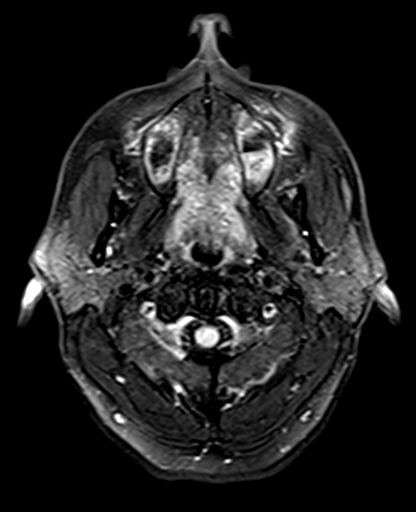
[im 27/27]
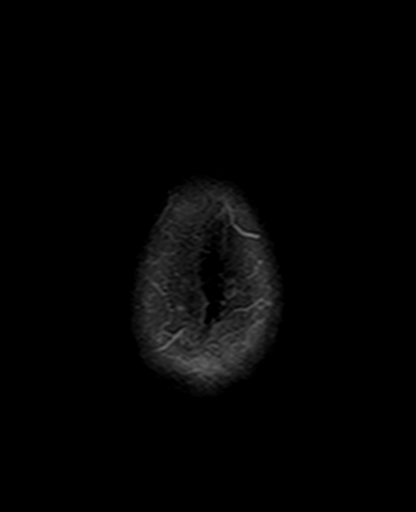

[Series 8: mag_images · axial · 3.0mm · 0.90mm/px · z∈[-96,+55]mm · 3 of 52 slices shown]
[im 1/52]
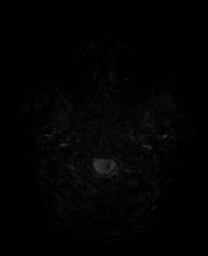
[im 26/52]
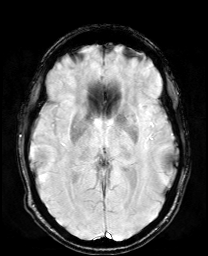
[im 52/52]
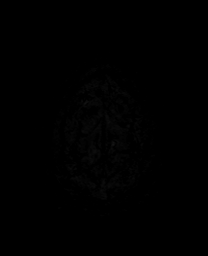

[Series 9: pha_images · axial · 3.0mm · 0.90mm/px · z∈[-96,+52]mm · 3 of 51 slices shown]
[im 1/51]
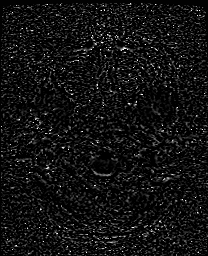
[im 26/51]
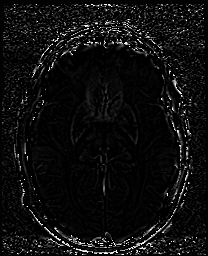
[im 51/51]
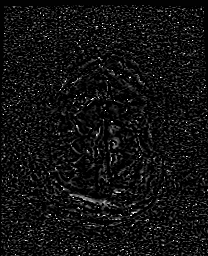

[Series 10: swi_images · axial · 3.0mm · 0.90mm/px · z∈[-96,+55]mm · 3 of 52 slices shown]
[im 1/52]
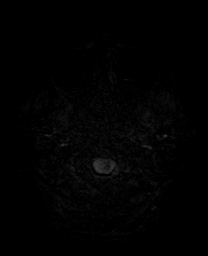
[im 26/52]
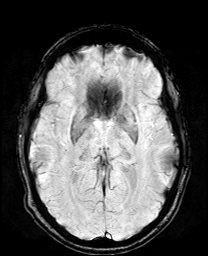
[im 52/52]
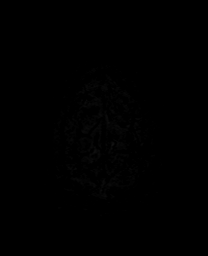

[Series 11: mip_images(sw) · axial · 24.0mm · 0.90mm/px · z∈[-86,+44]mm · 3 of 45 slices shown]
[im 1/45]
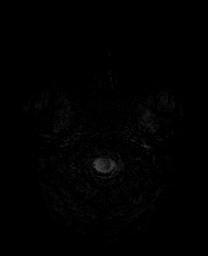
[im 23/45]
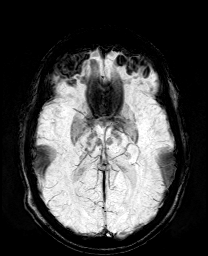
[im 45/45]
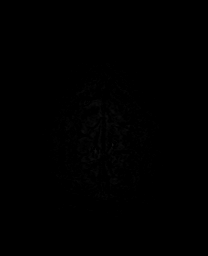

[Series 24: T2 post-contrast · coronal · 5.0mm · 0.72mm/px · 2 of 30 slices shown]
[im 1/30]
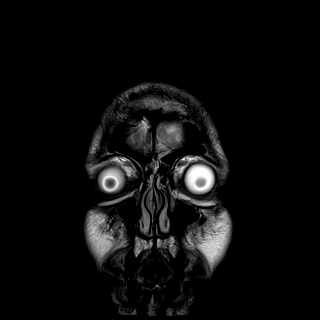
[im 30/30]
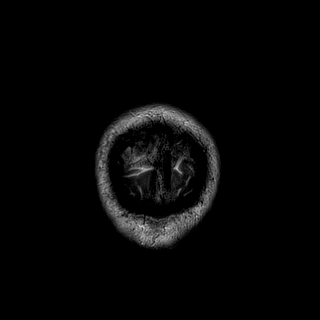

[Series 26: T1 post-contrast · coronal · 5.0mm · 0.34mm/px · 2 of 30 slices shown (1 of 2)]
[im 1/30]
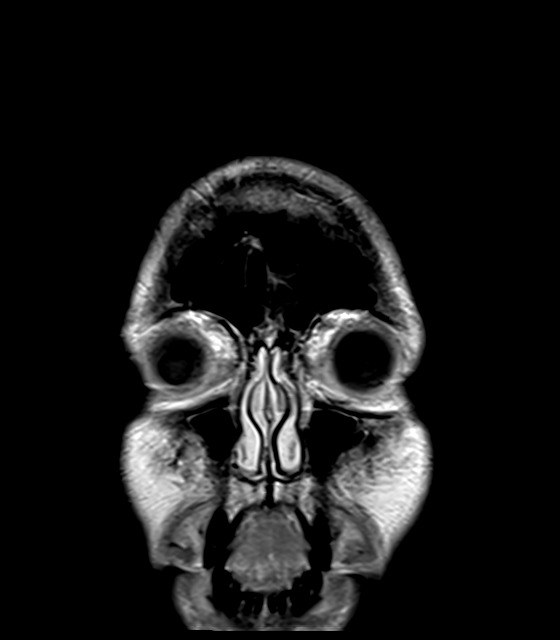
[im 30/30]
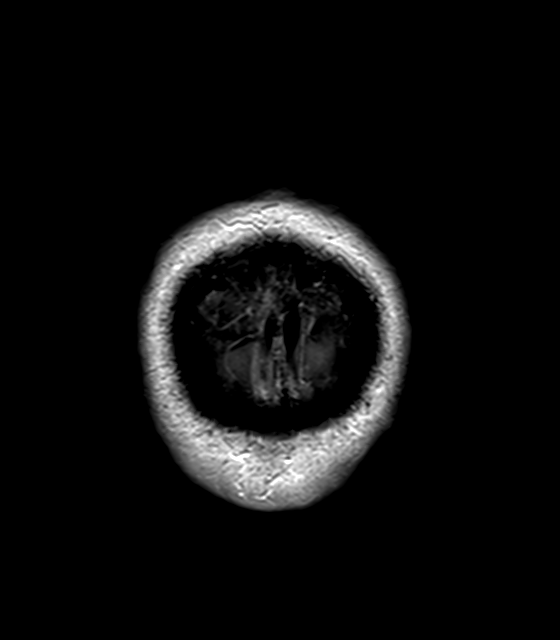

[Series 27: T1 post-contrast · sagittal · 5.0mm · 0.72mm/px · 1 of 23 slices shown (2 of 2)]
[im 1/23]
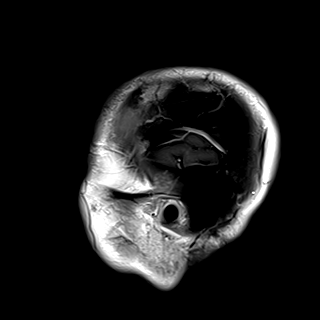

[41 of 48 positions shown; findings below may reference images not displayed]

FINDINGS: Brain: Cerebral volume within normal limits for age. Few scattered
subcentimeter foci of T2/FLAIR hyperintensity noted involving the
deep and subcortical white matter of the left greater than right
frontal lobes, nonspecific. Largest of these foci measures 6 mm at
the left frontal lobe (series 7, image 19). Findings are
nonspecific, but overall extremely mild in appearance for age. 6 mm
cystic lesion at the left cerebellum most consistent with a dilated
perivascular space (series 6, image 8).

No abnormal foci of restricted diffusion to suggest acute or
subacute ischemia. Gray-white matter differentiation maintained. No
encephalomalacia to suggest chronic cortical infarction. No evidence
for acute or chronic intracranial hemorrhage.

No mass lesion, midline shift or mass effect. Ventricles normal size
without hydrocephalus. No extra-axial fluid collection. Pituitary
gland and suprasellar region normal. Midline structures intact and
normal. No abnormal enhancement.

Vascular: Major intracranial vascular flow voids are maintained.

Skull and upper cervical spine: Craniocervical junction within
normal limits. Bone marrow signal intensity normal. No scalp soft
tissue abnormality.

Sinuses/Orbits: Globes and orbital soft tissues within normal
limits. Mild scattered mucosal thickening noted within the ethmoidal
air cells. Paranasal sinuses are otherwise clear. No mastoid
effusion. Inner ear structures grossly normal.

Other: None.
IMPRESSION: 1. No acute intracranial abnormality.
2. Few scattered subcentimeter foci of T2/FLAIR hyperintensity
involving the deep and subcortical white matter of the left greater
than right frontal lobes, nonspecific, but overall mild for age.
Primary differential considerations include sequelae of chronic
small-vessel ischemia, changes of migrainous disorder, or less
likely demyelination. No associated enhancement.
3. Otherwise normal brain MRI for age.

## 2022-03-27 IMAGING — MR MR THORACIC SPINE WO/W CM
5 of 9 series · 23 of 48 positions shown · IV contrast (Gadavist)
Comparison: None available.

CLINICAL DATA: Initial evaluation for numbness, tingling,
paresthesias in bilateral hands and feet. Mid back pain.

EXAM:
MRI THORACIC WITHOUT AND WITH CONTRAST
TECHNIQUE: Multiplanar and multiecho pulse sequences of the thoracic spine were
obtained without and with intravenous contrast.
CONTRAST:  7.5mL GADAVIST GADOBUTROL 1 MMOL/ML IV SOLN

[Series 3: T1 · sagittal · 6.0mm · 1.23mm/px · 1 of 9 slices shown (1 of 3)]
[im 1/9]
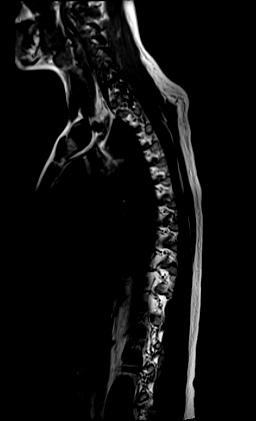

[Series 4: T2 · sagittal · 3.0mm · 0.76mm/px · 3 of 17 slices shown (1 of 2)]
[im 1/17]
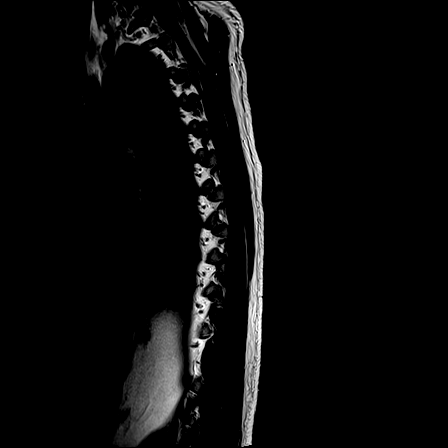
[im 9/17]
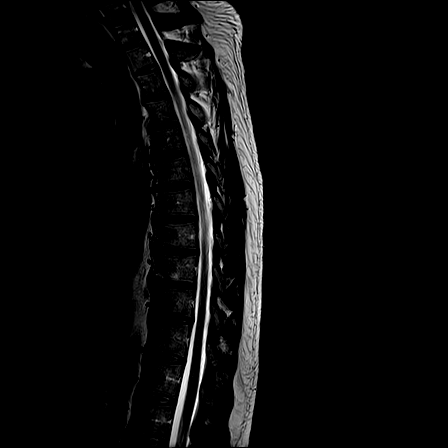
[im 17/17]
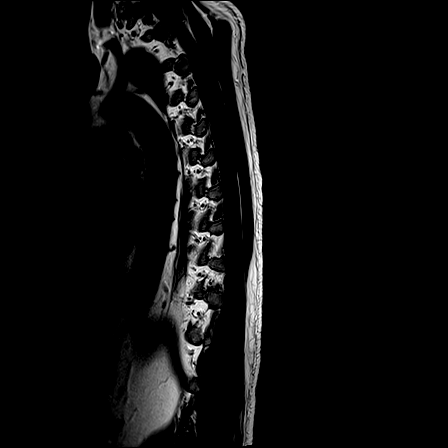

[Series 5: T1 · sagittal · 3.0mm · 0.76mm/px · 4 of 17 slices shown (2 of 3)]
[im 1/17]
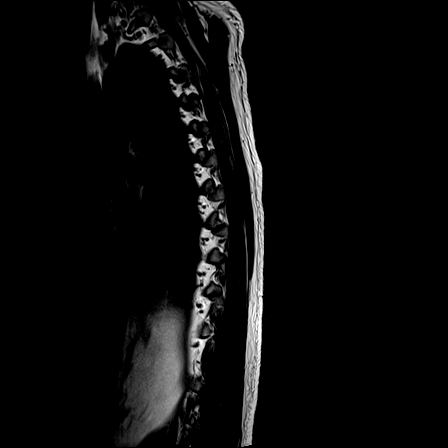
[im 6/17]
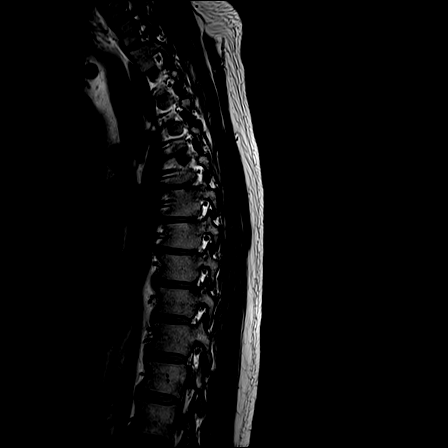
[im 11/17]
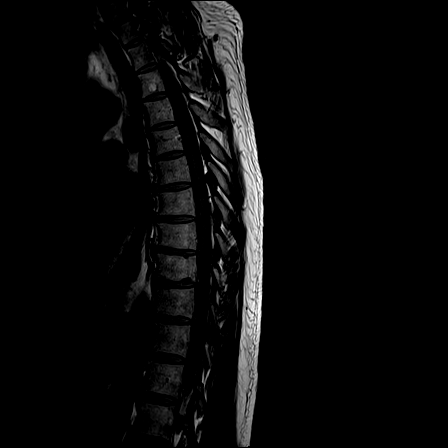
[im 17/17]
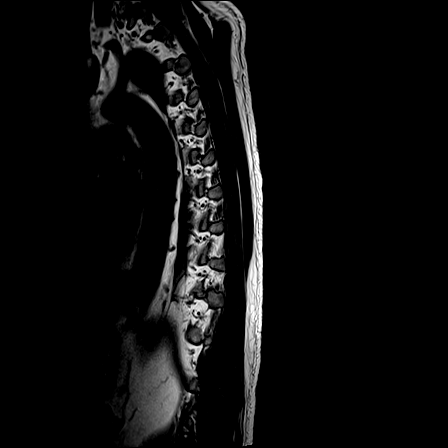

[Series 7: T2 · axial · 4.0mm · 0.59mm/px · z∈[-455,-204]mm · 8 of 39 slices shown (2 of 2)]
[im 1/39]
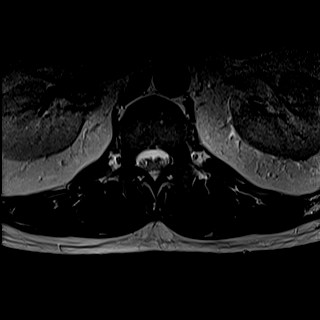
[im 6/39]
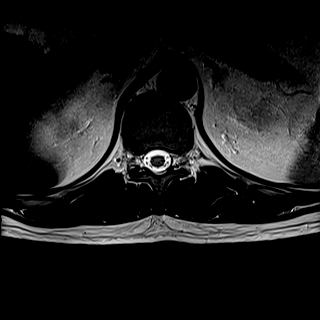
[im 11/39]
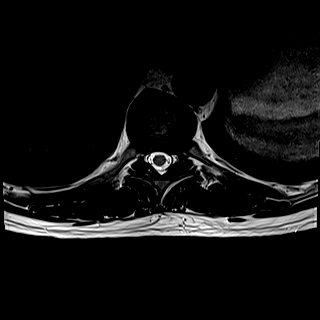
[im 17/39]
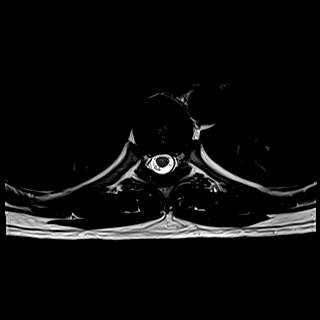
[im 22/39]
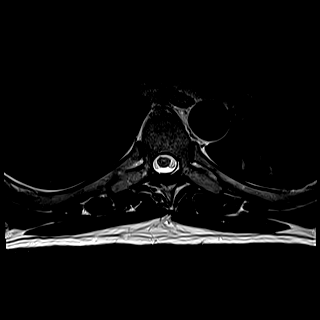
[im 28/39]
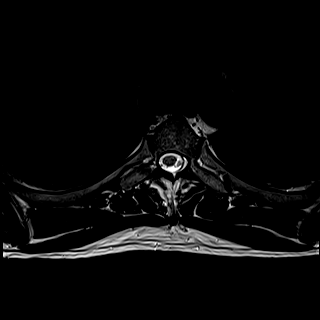
[im 33/39]
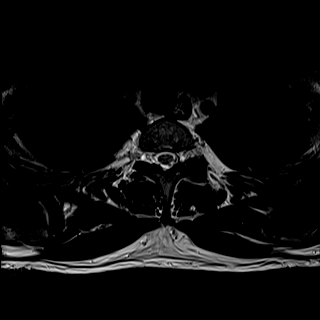
[im 39/39]
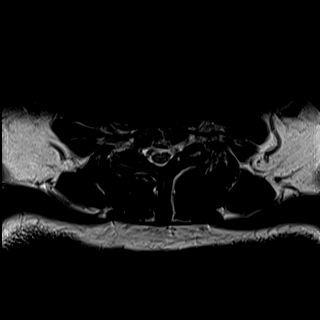

[Series 9: T1 · axial · non-contrast · 4.0mm · 0.31mm/px · z∈[-455,-234]mm · 7 of 39 slices shown (3 of 3)]
[im 1/39]
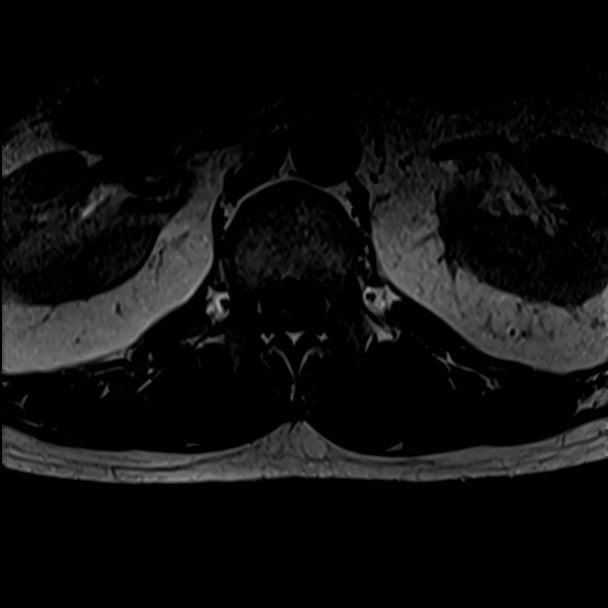
[im 6/39]
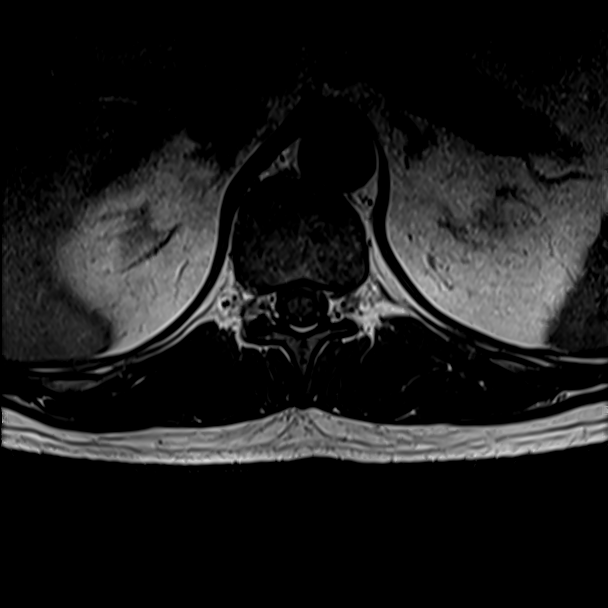
[im 11/39]
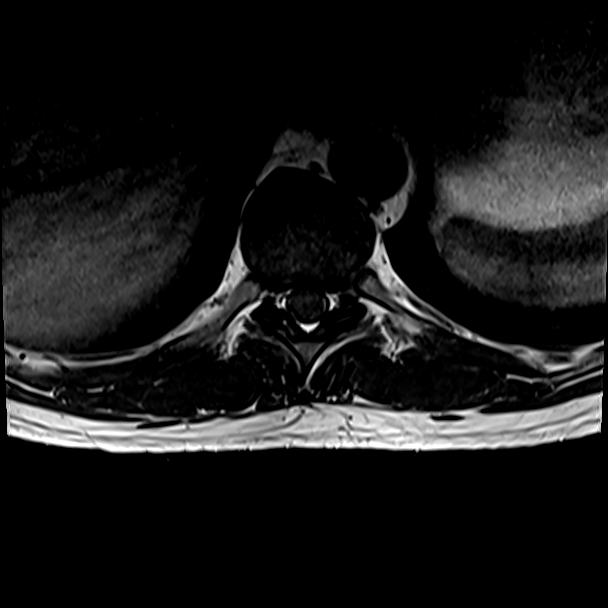
[im 17/39]
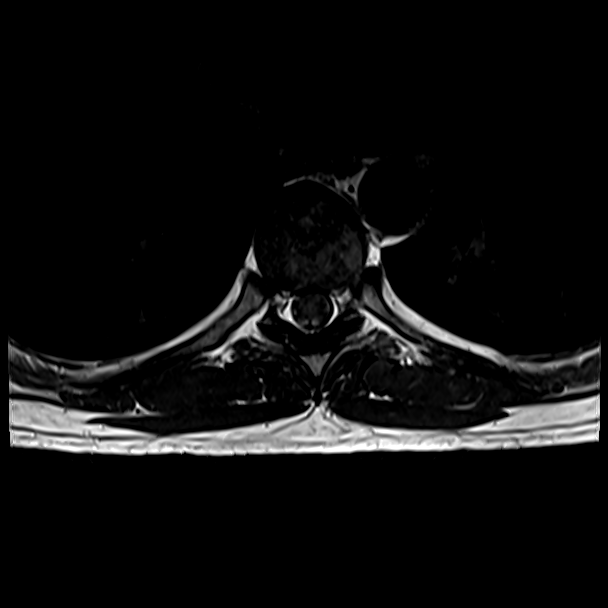
[im 22/39]
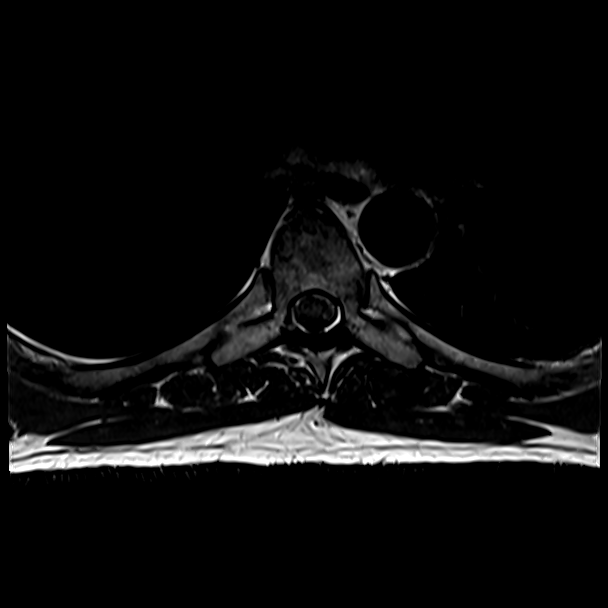
[im 28/39]
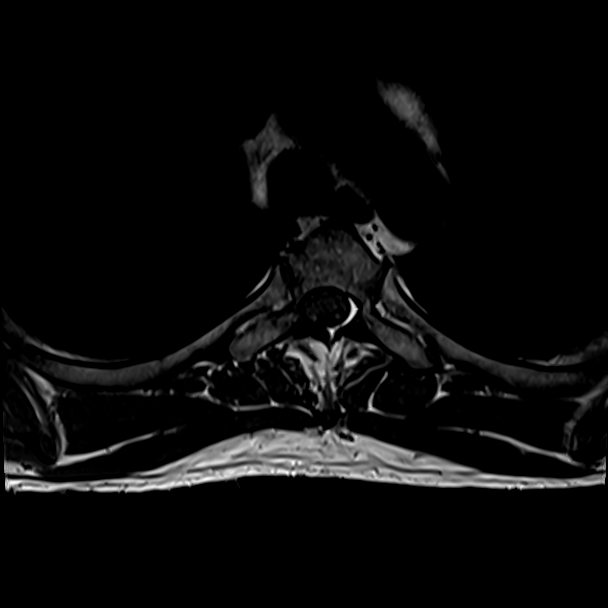
[im 33/39]
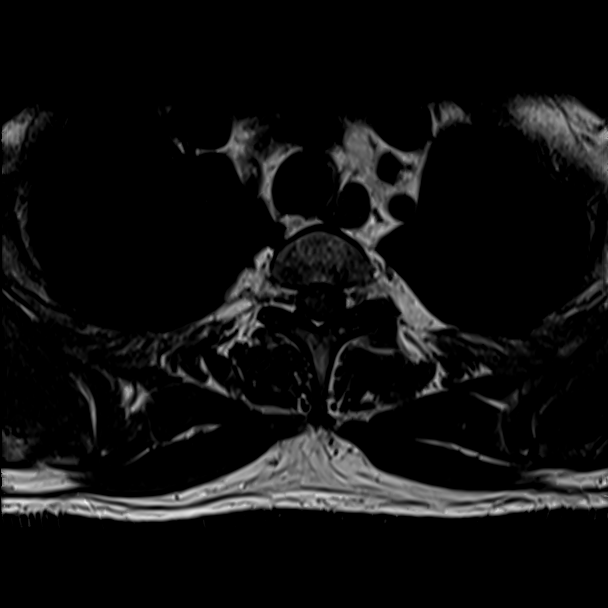

[23 of 48 positions shown; findings below may reference images not displayed]

FINDINGS: Alignment: Physiologic with preservation of the normal thoracic
kyphosis. No listhesis.

Vertebrae: Vertebral body height maintained without acute or chronic
fracture. Bone marrow signal intensity within normal limits. Few
scattered benign hemangiomata noted. No worrisome osseous lesions.
Minimal discogenic reactive endplate change present about the T9-10
interspace. No other abnormal marrow edema or enhancement.

Cord:  Normal signal and morphology.  No abnormal enhancement.

Paraspinal and other soft tissues: Paraspinous soft tissues within
normal limits. Visualized visceral structures are normal.

Disc levels:

Minimal disc desiccation noted within the midthoracic spine, within
normal expected limits for age. No significant disc bulge or focal
disc herniation. Mild multilevel facet hypertrophy. No significant
spinal stenosis. Foramina remain patent. No impingement.
IMPRESSION: 1. Normal MRI appearance of the thoracic spinal cord. No evidence
for demyelinating disease or other abnormality.
2. Minor degenerative disc disease for age. No significant stenosis
or neural impingement.

## 2024-06-21 ENCOUNTER — Other Ambulatory Visit: Payer: Self-pay | Admitting: Medical Genetics

## 2024-08-09 ENCOUNTER — Other Ambulatory Visit

## 2024-08-09 DIAGNOSIS — Z006 Encounter for examination for normal comparison and control in clinical research program: Secondary | ICD-10-CM

## 2024-08-22 LAB — GENECONNECT MOLECULAR SCREEN: Genetic Analysis Overall Interpretation: NEGATIVE
